# Patient Record
Sex: Female | Born: 2000
Health system: Southern US, Community
[De-identification: ages and names within clinical notes are randomized; demographics above are authoritative.]

## PROBLEM LIST (undated history)

## (undated) DIAGNOSIS — F329 Major depressive disorder, single episode, unspecified: Secondary | ICD-10-CM

## (undated) DIAGNOSIS — G43909 Migraine, unspecified, not intractable, without status migrainosus: Secondary | ICD-10-CM

## (undated) DIAGNOSIS — T50902A Poisoning by unspecified drugs, medicaments and biological substances, intentional self-harm, initial encounter: Secondary | ICD-10-CM

## (undated) DIAGNOSIS — F938 Other childhood emotional disorders: Secondary | ICD-10-CM

## (undated) HISTORY — PX: OTHER SURGICAL HISTORY: SHX169

## (undated) HISTORY — PX: WISDOM TOOTH EXTRACTION: SHX21

## (undated) HISTORY — PX: TONSILLECTOMY: SUR1361

---
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
## 2019-01-13 DIAGNOSIS — M79671 Pain in right foot: Secondary | ICD-10-CM | POA: Diagnosis not present

## 2019-01-13 DIAGNOSIS — M76821 Posterior tibial tendinitis, right leg: Secondary | ICD-10-CM | POA: Diagnosis not present

## 2019-01-13 DIAGNOSIS — Z23 Encounter for immunization: Secondary | ICD-10-CM | POA: Diagnosis not present

## 2019-01-20 DIAGNOSIS — M76821 Posterior tibial tendinitis, right leg: Secondary | ICD-10-CM | POA: Diagnosis not present

## 2019-01-20 DIAGNOSIS — M79671 Pain in right foot: Secondary | ICD-10-CM | POA: Diagnosis not present

## 2019-01-23 DIAGNOSIS — G43009 Migraine without aura, not intractable, without status migrainosus: Secondary | ICD-10-CM | POA: Diagnosis not present

## 2019-01-23 DIAGNOSIS — F329 Major depressive disorder, single episode, unspecified: Secondary | ICD-10-CM | POA: Diagnosis not present

## 2019-02-03 DIAGNOSIS — M76821 Posterior tibial tendinitis, right leg: Secondary | ICD-10-CM | POA: Diagnosis not present

## 2019-02-17 DIAGNOSIS — M76821 Posterior tibial tendinitis, right leg: Secondary | ICD-10-CM | POA: Diagnosis not present

## 2019-02-25 DIAGNOSIS — F329 Major depressive disorder, single episode, unspecified: Secondary | ICD-10-CM | POA: Diagnosis not present

## 2019-02-25 DIAGNOSIS — G43009 Migraine without aura, not intractable, without status migrainosus: Secondary | ICD-10-CM | POA: Diagnosis not present

## 2019-02-25 DIAGNOSIS — R32 Unspecified urinary incontinence: Secondary | ICD-10-CM | POA: Diagnosis not present

## 2019-02-27 DIAGNOSIS — R0602 Shortness of breath: Secondary | ICD-10-CM | POA: Diagnosis not present

## 2019-02-27 DIAGNOSIS — R079 Chest pain, unspecified: Secondary | ICD-10-CM | POA: Diagnosis not present

## 2019-02-27 DIAGNOSIS — R002 Palpitations: Secondary | ICD-10-CM | POA: Diagnosis not present

## 2019-02-27 DIAGNOSIS — F419 Anxiety disorder, unspecified: Secondary | ICD-10-CM | POA: Diagnosis not present

## 2019-04-10 ENCOUNTER — Emergency Department
Admission: EM | Admit: 2019-04-10 | Discharge: 2019-04-10 | Disposition: A | Discharge status: Home / Self Care | Payer: No Typology Code available for payment source | Attending: Emergency Medicine | Admitting: Emergency Medicine

## 2019-04-10 ENCOUNTER — Encounter: Payer: Self-pay | Admitting: *Deleted

## 2019-04-10 ENCOUNTER — Emergency Department: Payer: No Typology Code available for payment source

## 2019-04-10 ENCOUNTER — Other Ambulatory Visit: Payer: Self-pay

## 2019-04-10 DIAGNOSIS — Z7722 Contact with and (suspected) exposure to environmental tobacco smoke (acute) (chronic): Secondary | ICD-10-CM | POA: Insufficient documentation

## 2019-04-10 DIAGNOSIS — Y998 Other external cause status: Secondary | ICD-10-CM | POA: Diagnosis not present

## 2019-04-10 DIAGNOSIS — H538 Other visual disturbances: Secondary | ICD-10-CM | POA: Insufficient documentation

## 2019-04-10 DIAGNOSIS — S0990XA Unspecified injury of head, initial encounter: Secondary | ICD-10-CM | POA: Diagnosis not present

## 2019-04-10 DIAGNOSIS — Y92828 Other wilderness area as the place of occurrence of the external cause: Secondary | ICD-10-CM | POA: Diagnosis not present

## 2019-04-10 DIAGNOSIS — Y9389 Activity, other specified: Secondary | ICD-10-CM | POA: Insufficient documentation

## 2019-04-10 LAB — POC URINE PREG, ED: Preg Test, Ur: NEGATIVE
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
## 2019-04-10 NOTE — ED Notes (Signed)
Patient AAOx4. Vitals stable. NAD. 

## 2019-05-21 IMAGING — MR MR HEAD W/O CM
11 series · 46 of 48 positions shown · non-contrast
Comparison: CT head 09/07/2018

CLINICAL DATA: Numbness and tingling, paresthesia

EXAM:
MRI HEAD WITHOUT CONTRAST
TECHNIQUE: Multiplanar, multiecho pulse sequences of the brain and surrounding
structures were obtained without intravenous contrast.

[Series 2: ax dwi_tracew · axial · 3.0mm · 0.71mm/px · z∈[-113,+46]mm · 5 of 55 slices shown]
[im 1/55]
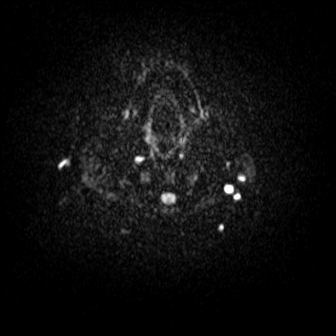
[im 14/55]
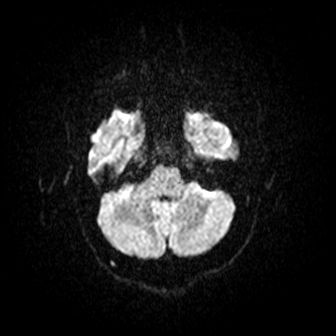
[im 28/55]
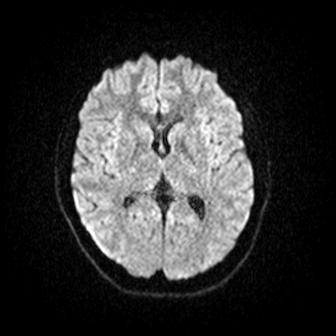
[im 41/55]
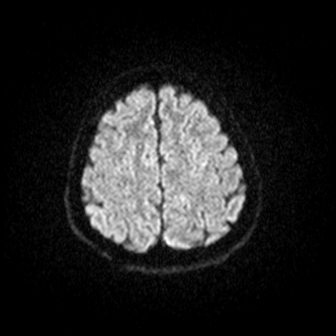
[im 55/55]
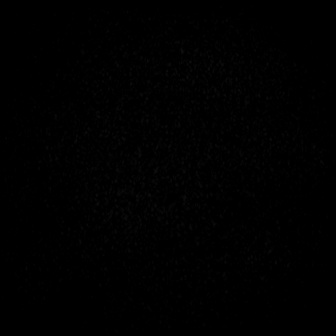

[Series 3: ax dwi_adc · axial · 3.0mm · 0.71mm/px · z∈[-113,+40]mm · 6 of 53 slices shown]
[im 1/53]
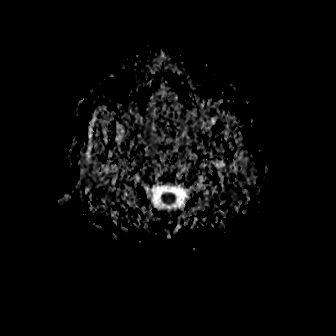
[im 11/53]
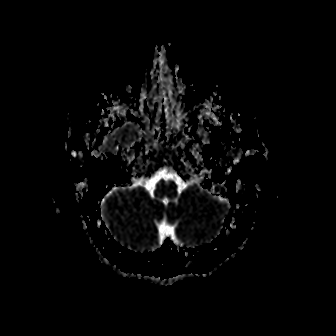
[im 21/53]
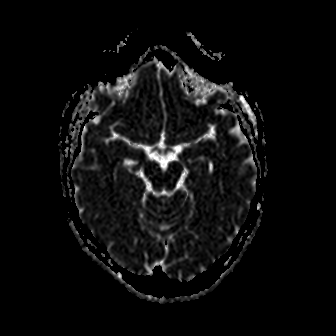
[im 32/53]
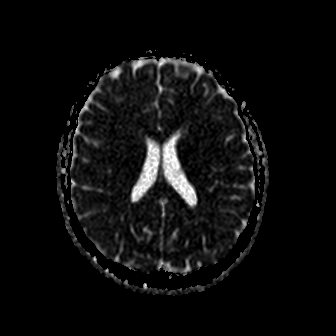
[im 42/53]
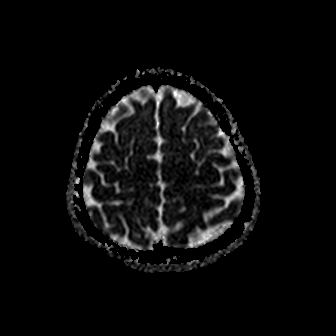
[im 53/53]
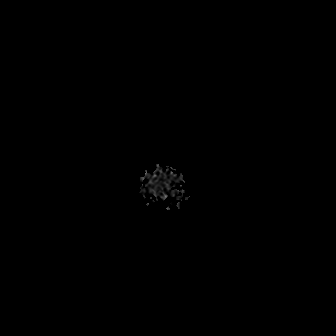

[Series 4: cor dwi_tracew · coronal · 5.0mm · 0.68mm/px · 5 of 40 slices shown]
[im 1/40]
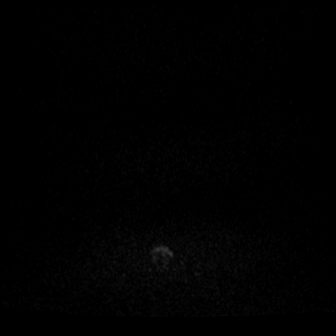
[im 10/40]
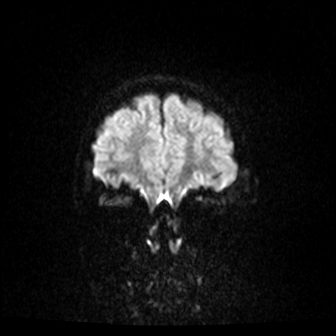
[im 20/40]
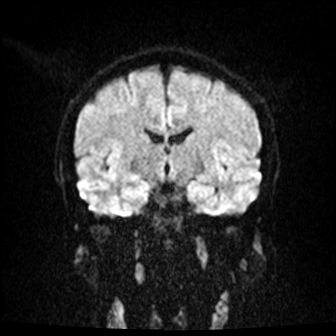
[im 30/40]
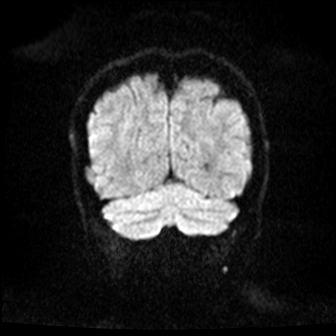
[im 40/40]
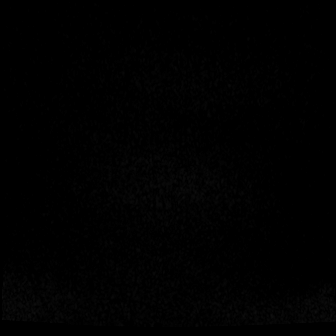

[Series 5: cor dwi_adc · coronal · 5.0mm · 0.68mm/px · 5 of 39 slices shown]
[im 1/39]
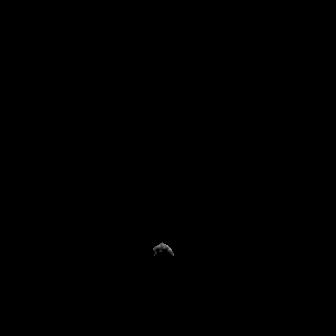
[im 10/39]
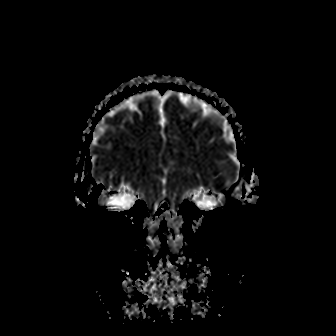
[im 20/39]
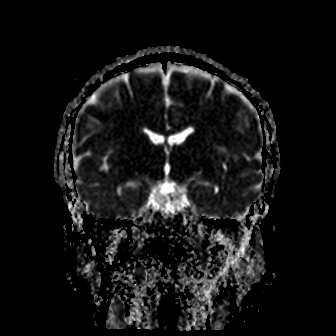
[im 29/39]
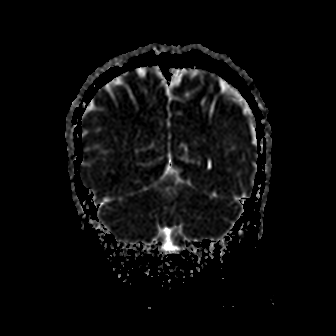
[im 39/39]
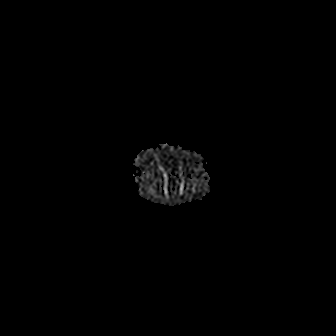

[Series 6: T1 · sagittal · 5.0mm · 0.94mm/px · 3 of 25 slices shown (1 of 2)]
[im 1/25]
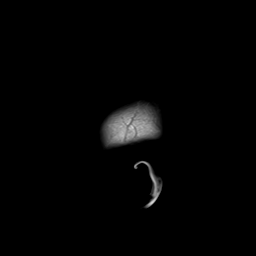
[im 13/25]
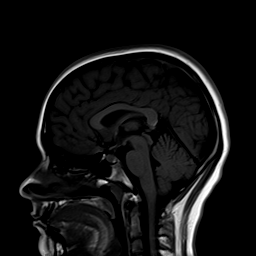
[im 25/25]
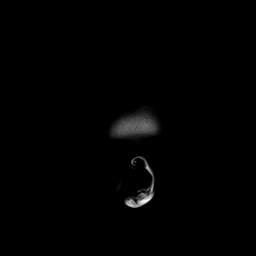

[Series 7: FLAIR · axial · 5.0mm · 1.20mm/px · z∈[-115,+38]mm · 3 of 27 slices shown]
[im 1/27]
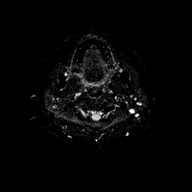
[im 14/27]
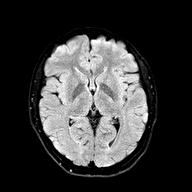
[im 27/27]
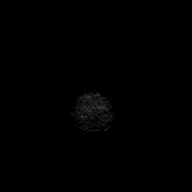

[Series 8: T2 · axial · 5.0mm · 0.45mm/px · z∈[-114,+38]mm · 3 of 27 slices shown (1 of 2)]
[im 1/27]
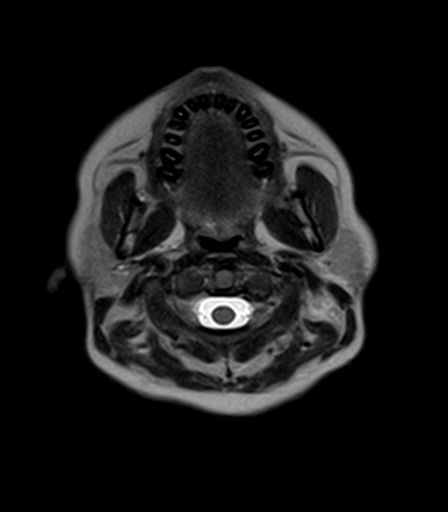
[im 14/27]
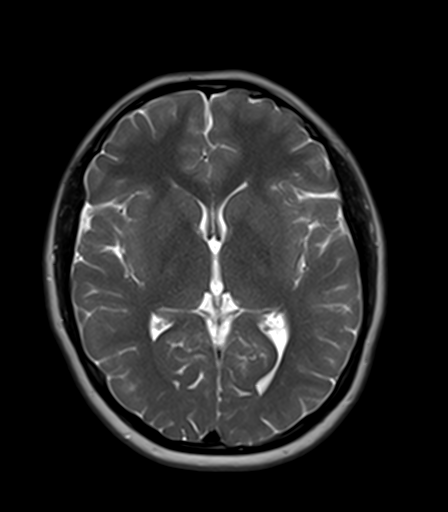
[im 27/27]
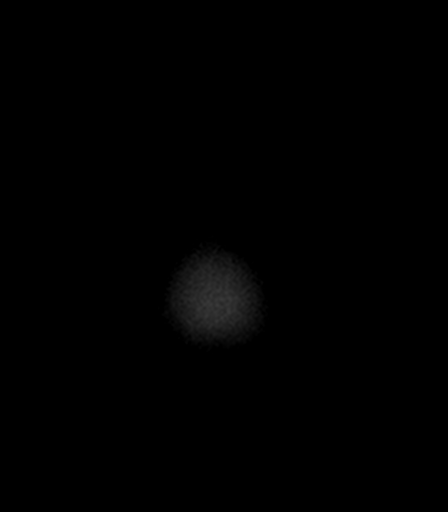

[Series 9: swi_images · axial · 3.0mm · 0.90mm/px · z∈[-121,+12]mm · 6 of 48 slices shown]
[im 1/48]
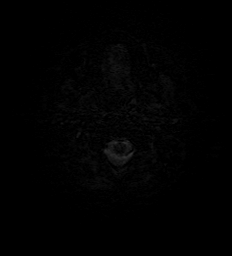
[im 10/48]
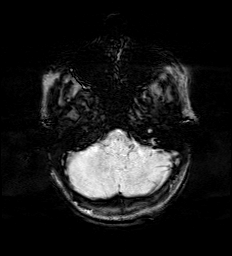
[im 19/48]
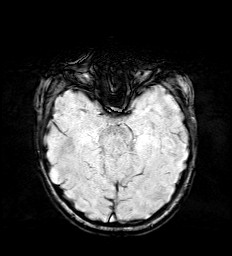
[im 29/48]
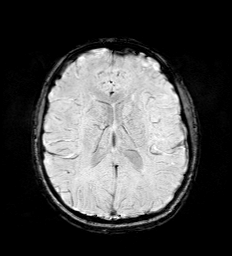
[im 38/48]
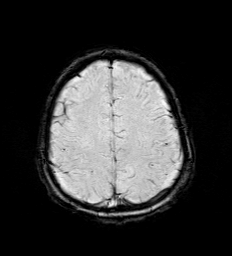
[im 48/48]
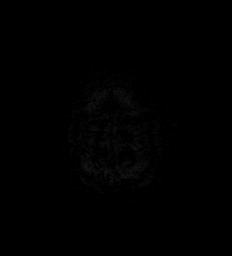

[Series 10: mip_images(sw) · axial · 24.0mm · 0.90mm/px · z∈[-111,-54]mm · 3 of 41 slices shown]
[im 1/41]
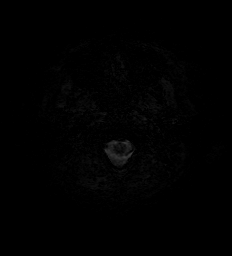
[im 11/41]
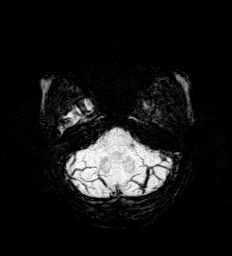
[im 21/41]
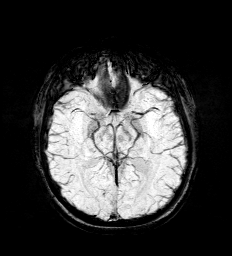

[Series 11: T1 · axial · 5.0mm · 0.90mm/px · z∈[-114,+38]mm · 3 of 27 slices shown (2 of 2)]
[im 1/27]
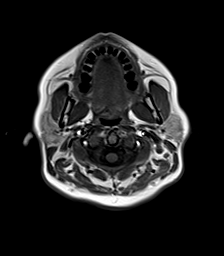
[im 14/27]
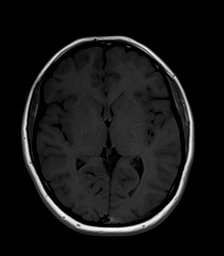
[im 27/27]
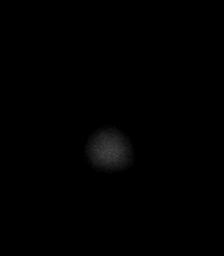

[Series 12: T2 · coronal · 5.0mm · 0.45mm/px · 4 of 31 slices shown (2 of 2)]
[im 1/31]
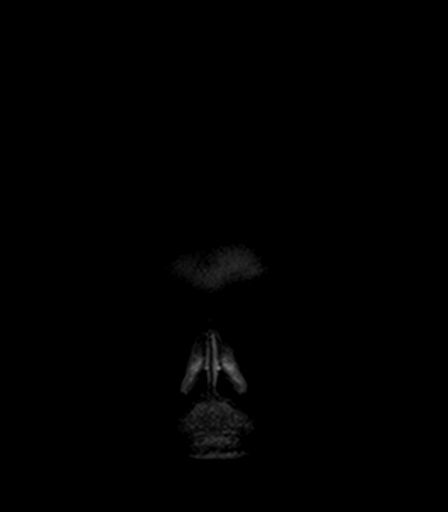
[im 11/31]
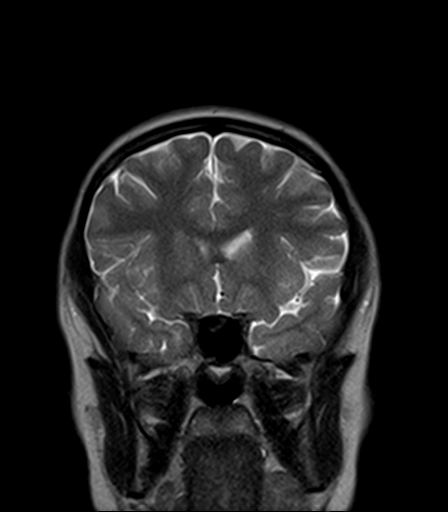
[im 21/31]
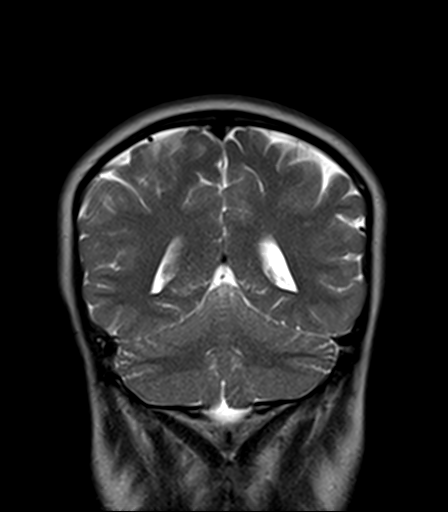
[im 31/31]
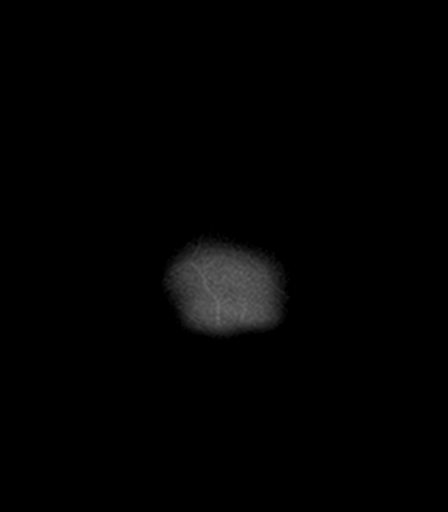

[46 of 48 positions shown; findings below may reference images not displayed]

FINDINGS: Brain: Ventricle size and cerebral volume normal. Negative for acute
infarct. Negative for hemorrhage or mass.

Solitary 5 mm hyperintensity left parietal subcortical white matter
best seen on FLAIR imaging but confirmed on axial T2 and coronal T2.
Remaining white matter normal. Brainstem and cerebellum normal.

Vascular: Normal arterial flow void

Skull and upper cervical spine: Negative

Sinuses/Orbits: Negative

Other: None
IMPRESSION: 5 mm hyperintensity left parietal subcortical white matter.
Otherwise negative. Differential diagnosis includes chronic ischemia
and demyelinating disease. Complex migraine headache also a
possibility. Neurologic correlation suggested.

## 2019-05-29 DIAGNOSIS — Z23 Encounter for immunization: Secondary | ICD-10-CM | POA: Diagnosis not present

## 2019-05-29 DIAGNOSIS — F329 Major depressive disorder, single episode, unspecified: Secondary | ICD-10-CM | POA: Diagnosis not present

## 2019-05-29 DIAGNOSIS — G43009 Migraine without aura, not intractable, without status migrainosus: Secondary | ICD-10-CM | POA: Diagnosis not present

## 2019-05-29 DIAGNOSIS — Z309 Encounter for contraceptive management, unspecified: Secondary | ICD-10-CM | POA: Diagnosis not present

## 2019-05-29 DIAGNOSIS — L309 Dermatitis, unspecified: Secondary | ICD-10-CM | POA: Diagnosis not present

## 2019-06-11 DIAGNOSIS — Z111 Encounter for screening for respiratory tuberculosis: Secondary | ICD-10-CM | POA: Diagnosis not present

## 2019-06-16 ENCOUNTER — Ambulatory Visit (INDEPENDENT_AMBULATORY_CARE_PROVIDER_SITE_OTHER): Payer: BLUE CROSS/BLUE SHIELD | Admitting: Obstetrics and Gynecology

## 2019-06-16 ENCOUNTER — Other Ambulatory Visit: Payer: Self-pay

## 2019-06-16 ENCOUNTER — Encounter: Payer: Self-pay | Admitting: Obstetrics and Gynecology

## 2019-06-16 VITALS — BP 118/74 | Ht 62.0 in | Wt 166.0 lb

## 2019-06-16 DIAGNOSIS — N926 Irregular menstruation, unspecified: Secondary | ICD-10-CM

## 2019-06-16 DIAGNOSIS — Z308 Encounter for other contraceptive management: Secondary | ICD-10-CM

## 2019-06-16 DIAGNOSIS — Z3202 Encounter for pregnancy test, result negative: Secondary | ICD-10-CM

## 2019-06-16 NOTE — Progress Notes (Signed)
Obstetrics & Gynecology Office Visit   Chief Complaint:  Chief Complaint  Patient presents with  . Contraception    pt late for cycle this month   Referral from Lonie PeakNathan Conroy, PA-C, from [facility] for evaluation of contraceptive options, including a copper IUD.  History of Present Illness: Patient is a 18 y.o. G1P0010 presenting for contraception discussion in referral from Lonie PeakNathan Conroy, Cordelia PochePA-C, from [facility].  She is currently on no form of contraception and desiring to start an IUD.  She has a past medical history significant for migraine with aura.  She specifically denies a history of chronic hypertension, history of DVT/PE and smoking.  Reported Patient's last menstrual period was 05/07/2019.  She had a Mirena IUD in the past.  It was removed due to some right-sided facial numbness that was thought to be attributable to the IUD.  She states that since her removal of her IUD she and her provider have determined that the more likely cause was due to her migraine headache issue.  She is on different medication now.  She states that she is 2 weeks late for her last menses.  She has taken home pregnancy tests, which have been negative.   Past Medical History:  Diagnosis Date  . 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 Major depressive disorder   . Suicide attempt by drug ingestion [facility](HCC)     Past Surgical History:  Procedure Laterality Date  . DILATION AND EVACUATION N/A 09/03/2015   Procedure: DILATATION AND EVACUATION;  Surgeon: Vena AustriaAndreas Staebler, MD;  Location: [facility] ORS;  Service: Gynecology;  Laterality: N/A;  . dnc    . TONSILLECTOMY N/A 04/25/2017   Procedure: TONSILLECTOMY;  Surgeon: Bud FaceVaught, Creighton, MD;  [facility];  Service: ENT;  Laterality: N/A;  . TONSILLECTOMY    . WISDOM TOOTH EXTRACTION      Gynecologic History: Patient's last menstrual period was 05/07/2019.  Obstetric History: G1P0010  Family  History  Problem Relation Age of Onset  . Hypertension Mother   . Anxiety disorder Mother   . Depression Mother   . Diabetes Maternal Grandmother   . Hypertension Maternal Grandmother   . Diabetes Maternal Grandfather   . Parkinson's disease Maternal Grandfather     Social History   Socioeconomic History  . Marital status: Single    Spouse name: Not on file  . Number of children: Not on file  . Years of education: Not on file  . Highest education level: Not on file  Occupational History  . Not on file  Social Needs  . Financial resource strain: Not on file  . Food insecurity    Worry: Not on file    Inability: Not on file  . Transportation needs    Medical: Not on file    Non-medical: Not on file  Tobacco Use  . Smoking status: Passive Smoke Exposure - Never Smoker  . Smokeless tobacco: Never Used  Substance and Sexual Activity  . Alcohol use: No  . Drug use: No  . Sexual activity: Yes    Birth control/protection: Pill  Lifestyle  . Physical activity    Days per week: 0 days    Minutes per session: Not on file  . Stress: Not on file  Relationships  . Social Musicianconnections    Talks on phone: Not on file    Gets together: Not on file    Attends religious service: Not on file    Active member of  club or organization: Not on file    Attends meetings of clubs or organizations: Not on file    Relationship status: Not on file  . Intimate partner violence    Fear of current or ex partner: Not on file    Emotionally abused: Not on file    Physically abused: Not on file    Forced sexual activity: Not on file  Other Topics Concern  . Not on file  Social History Narrative   ** Merged History Encounter **       Lives with Mother, Ronda Fairly.  No siblings.  1 cat.  Stepfather smokes outside.    No Known Allergies  Prior to Admission medications   Medication Sig Start Date End Date Taking? Authorizing Provider  ibuprofen (ADVIL,MOTRIN) 600 MG tablet Take 1 tablet (600 mg  total) by mouth every 8 (eight) hours as needed. 08/15/17  Yes Letitia Neri L, PA-C  lidocaine (XYLOCAINE) 2 % solution Use as directed 10 mLs in the mouth or throat every 6 (six) hours as needed for mouth pain (Swish and spit). 04/25/17  Yes Vaught, Jeannie Fend, MD  sertraline (ZOLOFT) 50 MG tablet Take 1.5 tablets (75 mg total) by mouth at bedtime. 03/20/17  Yes Ravi, Himabindu, MD  SUMAtriptan (IMITREX) 50 MG tablet Take 1 tablet (50 mg total) by mouth once as needed for migraine. May repeat in 2 hours if headache persists or recurs. 09/07/18 09/08/19 Yes Earleen Newport, MD    Review of Systems  Constitutional: Negative.   HENT: Negative.   Eyes: Negative.   Respiratory: Negative.   Cardiovascular: Negative.   Gastrointestinal: Negative.   Genitourinary: Negative.   Musculoskeletal: Negative.   Skin: Negative.   Neurological: Negative.   Psychiatric/Behavioral: Negative.      Physical Exam BP 118/74   Ht 5\' 2"  (1.575 m)   Wt 166 lb (75.3 kg)   LMP 05/07/2019   BMI 30.36 kg/m  Patient's last menstrual period was 05/07/2019. Physical Exam Constitutional:      General: She is not in acute distress.    Appearance: Normal appearance.  HENT:     Head: Normocephalic and atraumatic.  Eyes:     General: No scleral icterus.    Conjunctiva/sclera: Conjunctivae normal.  Neurological:     General: No focal deficit present.     Mental Status: She is alert and oriented to person, place, and time.     Cranial Nerves: No cranial nerve deficit.  Psychiatric:        Mood and Affect: Mood normal.        Behavior: Behavior normal.        Judgment: Judgment normal.   Urine Pregnancy Test: negative  Assessment: 18 y.o. G74P0010 female here for  1. Encounter for other contraceptive management   2. Missed period      Plan: Problem List Items Addressed This Visit    None    Visit Diagnoses    Encounter for other contraceptive management    -  Primary   Missed period        Relevant Orders   POCT urine pregnancy     Reviewed all forms of birth control options available including abstinence; over the counter/barrier methods; hormonal contraceptive medication including pill, patch, ring, injection,contraceptive implant; hormonal and nonhormonal IUDs; permanent sterilization options including vasectomy and the various tubal sterilization modalities. Risks and benefits reviewed.  Questions were answered.  Information was given to patient to review.  Copper IUDs were reviewed in detail.  I do think that she would be a good candidate for this form of birth control.  She did have questions regarding all types of IUDs.  We reviewed them each in detail.  Given that she believes her facial numbness may have been due to migraines rather than her IUD, she is considering a progesterone form of contraception.  I asked her to discuss this with her provider that manages her headaches.  There is no contraindication to progesterone in the setting of migraine headaches.  30 minutes spent in face to face discussion with > 50% spent in counseling,management, and coordination of care of her contraceptive management.   Thomasene MohairStephen , MD 06/16/2019 11:04 AM     CC: Lonie Peakonroy, Nathan, PA-C [address] [place],  [place] [id number]

## 2019-06-16 NOTE — Patient Instructions (Signed)
IUD options: Paragard: Copper IUD Mirena Psychologist, occupational): progesterone containing IUD Liletta: Very similar to [place], nearly the same thing Kyleena Psychologist, occupational): 5 year IUD with progesterone, smaller dose than Mirena Skyla Psychologist, occupational):  3 year IUD with progesterone, smaller dose than [place].

## 2019-06-17 ENCOUNTER — Encounter: Payer: Self-pay | Admitting: Obstetrics and Gynecology

## 2019-06-17 LAB — POCT URINE PREGNANCY: Preg Test, Ur: NEGATIVE

## 2019-07-06 DIAGNOSIS — Z3202 Encounter for pregnancy test, result negative: Secondary | ICD-10-CM | POA: Diagnosis not present

## 2019-07-06 DIAGNOSIS — N912 Amenorrhea, unspecified: Secondary | ICD-10-CM | POA: Diagnosis not present

## 2019-07-06 DIAGNOSIS — Z20828 Contact with and (suspected) exposure to other viral communicable diseases: Secondary | ICD-10-CM | POA: Diagnosis not present

## 2019-07-06 DIAGNOSIS — J3489 Other specified disorders of nose and nasal sinuses: Secondary | ICD-10-CM | POA: Diagnosis not present

## 2019-07-07 ENCOUNTER — Ambulatory Visit (INDEPENDENT_AMBULATORY_CARE_PROVIDER_SITE_OTHER): Payer: BC Managed Care – PPO | Admitting: Obstetrics and Gynecology

## 2019-07-07 ENCOUNTER — Encounter: Payer: Self-pay | Admitting: Obstetrics and Gynecology

## 2019-07-07 ENCOUNTER — Other Ambulatory Visit: Payer: Self-pay

## 2019-07-07 VITALS — BP 102/70 | Ht 63.0 in | Wt 169.0 lb

## 2019-07-07 DIAGNOSIS — Z3202 Encounter for pregnancy test, result negative: Secondary | ICD-10-CM | POA: Diagnosis not present

## 2019-07-07 DIAGNOSIS — N914 Secondary oligomenorrhea: Secondary | ICD-10-CM

## 2019-07-07 DIAGNOSIS — N912 Amenorrhea, unspecified: Secondary | ICD-10-CM

## 2019-07-07 LAB — POCT URINE PREGNANCY: Preg Test, Ur: NEGATIVE

## 2019-07-07 MED ORDER — MEDROXYPROGESTERONE ACETATE 10 MG PO TABS: 10.0000 mg | ORAL_TABLET | Freq: Every day | ORAL | 0 refills | Status: DC

## 2019-07-07 NOTE — Progress Notes (Signed)
Obstetrics & Gynecology Office Visit   Chief Complaint  Patient presents with  . Amenorrhea    No cycle since June, negative urine pregnancy tests   History of Present Illness: 18 y.o. 531P0010 female who presents with no menses since late May. Prior to late May her menses would come each month and lasted a predictable amount of time.  She has been on no form of birth control.  She would like to try the Mirena again.  She has had no symptoms of having a period.  Her appetite is much decreased.  However, she hasn't lost much weight. She has had no medication changes. She denies any new stressors in her life.   Past Medical History:  Diagnosis Date  . 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 Major depressive disorder   . Suicide attempt by drug ingestion [facility](HCC)     Past Surgical History:  Procedure Laterality Date  . DILATION AND EVACUATION N/A 09/03/2015   Procedure: DILATATION AND EVACUATION;  Surgeon: Vena AustriaAndreas Staebler, MD;  Location: [facility] ORS;  Service: Gynecology;  Laterality: N/A;  . dnc    . TONSILLECTOMY N/A 04/25/2017   Procedure: TONSILLECTOMY;  Surgeon: Bud FaceVaught, Creighton, MD;  [facility];  Service: ENT;  Laterality: N/A;  . TONSILLECTOMY    . WISDOM TOOTH EXTRACTION      Gynecologic History: Patient's last menstrual period was 04/28/2019 (approximate).  Obstetric History: G1P0010  Family History  Problem Relation Age of Onset  . Hypertension Mother   . Anxiety disorder Mother   . Depression Mother   . Diabetes Maternal Grandmother   . Hypertension Maternal Grandmother   . Diabetes Maternal Grandfather   . Parkinson's disease Maternal Grandfather     Social History   Socioeconomic History  . Marital status: Single    Spouse name: Not on file  . Number of children: Not on file  . Years of education: Not on file  . Highest education level: Not on file  Occupational History  . Not on file  Social Needs  . Financial resource strain: Not on  file  . Food insecurity    Worry: Not on file    Inability: Not on file  . Transportation needs    Medical: Not on file    Non-medical: Not on file  Tobacco Use  . Smoking status: Passive Smoke Exposure - Never Smoker  . Smokeless tobacco: Never Used  Substance and Sexual Activity  . Alcohol use: No  . Drug use: No  . Sexual activity: Yes  Lifestyle  . Physical activity    Days per week: 0 days    Minutes per session: Not on file  . Stress: Not on file  Relationships  . Social Musicianconnections    Talks on phone: Not on file    Gets together: Not on file    Attends religious service: Not on file    Active member of club or organization: Not on file    Attends meetings of clubs or organizations: Not on file    Relationship status: Not on file  . Intimate partner violence    Fear of current or ex partner: Not on file    Emotionally abused: Not on file    Physically abused: Not on file    Forced sexual activity: Not on file  Other Topics Concern  . Not on file  Social History Narrative   ** Merged History Encounter **       Lives with Mother, Peggye PittStepdad.  No siblings.  1 cat.  Stepfather smokes outside.    No Known Allergies  Prior to Admission medications   Medication Sig Start Date End Date Taking? Authorizing Provider  ibuprofen (ADVIL,MOTRIN) 600 MG tablet Take 1 tablet (600 mg total) by mouth every 8 (eight) hours as needed. 08/15/17  Yes Letitia Neri L, PA-C  lidocaine (XYLOCAINE) 2 % solution Use as directed 10 mLs in the mouth or throat every 6 (six) hours as needed for mouth pain (Swish and spit). 04/25/17  Yes Vaught, Jeannie Fend, MD  sertraline (ZOLOFT) 50 MG tablet Take 1.5 tablets (75 mg total) by mouth at bedtime. 03/20/17  Yes Ravi, Himabindu, MD  SUMAtriptan (IMITREX) 50 MG tablet Take 1 tablet (50 mg total) by mouth once as needed for migraine. May repeat in 2 hours if headache persists or recurs. 09/07/18 09/08/19 Yes Earleen Newport, MD    Review of Systems   Constitutional: Negative.   HENT: Negative.   Eyes: Negative.   Respiratory: Negative.   Cardiovascular: Negative.   Gastrointestinal: Negative.   Genitourinary: Negative.   Musculoskeletal: Negative.   Skin: Negative.   Neurological: Negative.   Psychiatric/Behavioral: Negative.      Physical Exam BP 102/70   Ht 5\' 3"  (1.6 m)   Wt 169 lb (76.7 kg)   LMP 04/28/2019 (Approximate)   BMI 29.94 kg/m  Patient's last menstrual period was 04/28/2019 (approximate). Physical Exam Constitutional:      General: She is not in acute distress.    Appearance: Normal appearance.  HENT:     Head: Normocephalic and atraumatic.  Eyes:     General: No scleral icterus.    Conjunctiva/sclera: Conjunctivae normal.  Neurological:     General: No focal deficit present.     Mental Status: She is alert and oriented to person, place, and time.     Cranial Nerves: No cranial nerve deficit.  Psychiatric:        Mood and Affect: Mood normal.        Behavior: Behavior normal.        Judgment: Judgment normal.   Urine Pregnancy Test: Negative  Assessment: 18 y.o. G106P0010 female here for  1. Secondary oligomenorrhea   2. Amenorrhea      Plan: Problem List Items Addressed This Visit    None    Visit Diagnoses    Secondary oligomenorrhea    -  Primary   Relevant Medications   medroxyPROGESTERone (PROVERA) 10 MG tablet   Amenorrhea       Relevant Orders   POCT urine pregnancy (Completed)     Oligomenorrhea: Etiology is unclear.  Diagnostic considerations include PCOS, other anovulatory etiologies. As this is a single occurrence for her and she has no track record of oligomenorrhea, it is reasonable to not pursue a diagnostic work-up for PCOS.  However, if her symptoms were to continue a work-up would be indicated.  She denies hirsutism or increase in acne.  So there are no physical manifestations of hyperandrogenism.  We will try to affect menstruation using progesterone withdrawal.  She would  like an IUD placed after her menses.  Therefore once her withdrawal begins we will schedule her for a Mirena IUD placement.  We did discuss that her menses may be heavier than normal since she has not had a period in several months.  15 minutes spent in face to face discussion with > 50% spent in counseling,management, and coordination of care of her secondary oligomenorrhea.   Prentice Docker, MD  07/07/2019 4:37 PM

## 2019-07-22 ENCOUNTER — Telehealth: Payer: Self-pay

## 2019-07-22 NOTE — Telephone Encounter (Signed)
Pt having problems with the [facility] pills wants to know what to do to get the IUD,

## 2019-07-22 NOTE — Telephone Encounter (Signed)
Please schedule for Birth control consult when there is an opening. No need for double book. Thank you so much!

## 2019-07-23 ENCOUNTER — Telehealth: Payer: Self-pay | Admitting: Obstetrics and Gynecology

## 2019-07-23 NOTE — Telephone Encounter (Signed)
Patient is schedule for 08/11/19 at 3:50 with [facility] for Consult with possible Mirena insertion

## 2019-07-25 NOTE — Telephone Encounter (Signed)
Noted. Will order to arrive by apt date/time. 

## 2019-08-11 ENCOUNTER — Encounter: Payer: Self-pay | Admitting: Obstetrics and Gynecology

## 2019-08-11 ENCOUNTER — Other Ambulatory Visit: Payer: Self-pay

## 2019-08-11 ENCOUNTER — Ambulatory Visit (INDEPENDENT_AMBULATORY_CARE_PROVIDER_SITE_OTHER): Payer: BC Managed Care – PPO | Admitting: Obstetrics and Gynecology

## 2019-08-11 VITALS — BP 118/74 | Ht 63.0 in | Wt 179.0 lb

## 2019-08-11 DIAGNOSIS — N912 Amenorrhea, unspecified: Secondary | ICD-10-CM | POA: Diagnosis not present

## 2019-08-11 NOTE — Progress Notes (Signed)
Obstetrics & Gynecology Office Visit   Chief Complaint  Patient presents with  . Amenorrhea   History of Present Illness: Patient is a 18 y.o. G51P0010 female who presents in follow-up from her visit in August.  She states that she took Provera for 10 days after her last visit.  She only had a very short interval of bleeding (part of a day).  She has had no symptoms since that time.  She would like to inquire about why she is not having periods and determine what form of birth control she should be on.  She has no other symptoms at this time.  Of note, she was diagnosed with COVID-19 in the interim in September and quarantined for 3 weeks.  She has no symptoms of that at this time.  Past Medical History:  Diagnosis Date  . 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 Major depressive disorder   . Suicide attempt by drug ingestion [facility])     Past Surgical History:  Procedure Laterality Date  . DILATION AND EVACUATION N/A 09/03/2015   Procedure: DILATATION AND EVACUATION;  Surgeon: Vena Austria, MD;  Location: [facility] ORS;  Service: Gynecology;  Laterality: N/A;  . dnc    . TONSILLECTOMY N/A 04/25/2017   Procedure: TONSILLECTOMY;  Surgeon: Bud Face, MD;  [facility];  Service: ENT;  Laterality: N/A;  . TONSILLECTOMY    . WISDOM TOOTH EXTRACTION      Gynecologic History: Patient's last menstrual period was 04/11/2019.  Obstetric History: G1P0010  Family History  Problem Relation Age of Onset  . Hypertension Mother   . Anxiety disorder Mother   . Depression Mother   . Diabetes Maternal Grandmother   . Hypertension Maternal Grandmother   . Diabetes Maternal Grandfather   . Parkinson's disease Maternal Grandfather     Social History   Socioeconomic History  . Marital status: Single    Spouse name: Not on file  . Number of children: Not on file  . Years of education: Not on file  . Highest education level: Not on file  Occupational History  . Not  on file  Social Needs  . Financial resource strain: Not on file  . Food insecurity    Worry: Not on file    Inability: Not on file  . Transportation needs    Medical: Not on file    Non-medical: Not on file  Tobacco Use  . Smoking status: Passive Smoke Exposure - Never Smoker  . Smokeless tobacco: Never Used  Substance and Sexual Activity  . Alcohol use: No  . Drug use: No  . Sexual activity: Yes  Lifestyle  . Physical activity    Days per week: 0 days    Minutes per session: Not on file  . Stress: Not on file  Relationships  . Social Musician on phone: Not on file    Gets together: Not on file    Attends religious service: Not on file    Active member of club or organization: Not on file    Attends meetings of clubs or organizations: Not on file    Relationship status: Not on file  . Intimate partner violence    Fear of current or ex partner: Not on file    Emotionally abused: Not on file    Physically abused: Not on file    Forced sexual activity: Not on file  Other Topics Concern  . Not on file  Social History Narrative   **  Merged History Encounter **       Lives with Mother, Ronda Fairly.  No siblings.  1 cat.  Stepfather smokes outside.    No Known Allergies  Prior to Admission medications   Medication Sig Start Date End Date Taking? Authorizing Provider  ibuprofen (ADVIL,MOTRIN) 600 MG tablet Take 1 tablet (600 mg total) by mouth every 8 (eight) hours as needed. 08/15/17  Yes Letitia Neri L, PA-C  lidocaine (XYLOCAINE) 2 % solution Use as directed 10 mLs in the mouth or throat every 6 (six) hours as needed for mouth pain (Swish and spit). 04/25/17  Yes Vaught, Jeannie Fend, MD  sertraline (ZOLOFT) 50 MG tablet Take 1.5 tablets (75 mg total) by mouth at bedtime. 03/20/17  Yes Ravi, Himabindu, MD  SUMAtriptan (IMITREX) 100 MG tablet Take 100 mg by mouth daily. 02/21/19  Yes [provider]  triamcinolone cream (KENALOG) 0.1 %  05/23/18  Yes [provider]  medroxyPROGESTERone (PROVERA) 10 MG tablet Take 1 tablet (10 mg total) by mouth daily for 10 days. 07/07/19 07/17/19  Will Bonnet, MD    Review of Systems  Constitutional: Negative.   HENT: Negative.   Eyes: Negative.   Respiratory: Negative.   Cardiovascular: Negative.   Gastrointestinal: Negative.   Genitourinary: Negative.   Musculoskeletal: Negative.   Skin: Negative.   Neurological: Negative.   Psychiatric/Behavioral: Negative.      Physical Exam BP 118/74   Ht 5\' 3"  (1.6 m)   Wt 179 lb (81.2 kg)   LMP 04/11/2019   BMI 31.71 kg/m  Patient's last menstrual period was 04/11/2019. Physical Exam Constitutional:      General: She is not in acute distress.    Appearance: Normal appearance.  HENT:     Head: Normocephalic and atraumatic.  Eyes:     General: No scleral icterus.    Conjunctiva/sclera: Conjunctivae normal.  Neurological:     General: No focal deficit present.     Mental Status: She is alert and oriented to person, place, and time.     Cranial Nerves: No cranial nerve deficit.  Psychiatric:        Mood and Affect: Mood normal.        Behavior: Behavior normal.        Judgment: Judgment normal.     Female chaperone present for pelvic and breast  portions of the physical exam  Assessment: 18 y.o. G50P0010 female here for  1. Amenorrhea      Plan: Problem List Items Addressed This Visit    None    Visit Diagnoses    Amenorrhea    -  Primary   Relevant Orders   US PELVIS TRANSVAGINAL NON-OB (TV ONLY)     Will initiate a secondary amenorrhea work-up.  We will start with pelvic ultrasound and the patient will need lab work at her next visit.  15 minutes spent in face to face discussion with > 50% spent in counseling,management, and coordination of care of her secondary amenorrhea.  Prentice Docker, MD 08/11/2019 4:10 PM

## 2019-08-12 ENCOUNTER — Ambulatory Visit (INDEPENDENT_AMBULATORY_CARE_PROVIDER_SITE_OTHER): Payer: BC Managed Care – PPO

## 2019-08-12 DIAGNOSIS — N912 Amenorrhea, unspecified: Secondary | ICD-10-CM

## 2019-08-12 DIAGNOSIS — N83201 Unspecified ovarian cyst, right side: Secondary | ICD-10-CM | POA: Diagnosis not present

## 2019-08-13 ENCOUNTER — Other Ambulatory Visit: Payer: Self-pay

## 2019-08-13 ENCOUNTER — Ambulatory Visit (INDEPENDENT_AMBULATORY_CARE_PROVIDER_SITE_OTHER): Payer: BC Managed Care – PPO | Admitting: Obstetrics and Gynecology

## 2019-08-13 ENCOUNTER — Encounter: Payer: Self-pay | Admitting: Obstetrics and Gynecology

## 2019-08-13 VITALS — BP 131/87 | HR 102 | Wt 174.0 lb

## 2019-08-13 DIAGNOSIS — N911 Secondary amenorrhea: Secondary | ICD-10-CM | POA: Diagnosis not present

## 2019-08-13 NOTE — Progress Notes (Signed)
Gynecology Ultrasound Follow Up   Chief Complaint  Patient presents with  . Follow-up    Gyn Ultrasound  Secondary amenorrhea   History of Present Illness: Patient is a 18 y.o. female who presents today for ultrasound evaluation of the above .  Ultrasound demonstrates the following findings Adnexa: 2 cm complex right ovarian cyst. No internal blood flow. Likely a corpus luteal cyst. Uterus: anteverted with endometrial stripe  5.9 mm Additional: no abnormalities noted.  See prior note for bleeding history.   Past Medical History:  Diagnosis Date  . 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 Major depressive disorder   . Suicide attempt by drug ingestion [facility])     Past Surgical History:  Procedure Laterality Date  . DILATION AND EVACUATION N/A 09/03/2015   Procedure: DILATATION AND EVACUATION;  Surgeon: Vena Austria, MD;  Location: [facility] ORS;  Service: Gynecology;  Laterality: N/A;  . dnc    . TONSILLECTOMY N/A 04/25/2017   Procedure: TONSILLECTOMY;  Surgeon: Bud Face, MD;  Location: [facility];  Service: ENT;  Laterality: N/A;  . TONSILLECTOMY    . WISDOM TOOTH EXTRACTION       Family History  Problem Relation Age of Onset  . Hypertension Mother   . Anxiety disorder Mother   . Depression Mother   . Diabetes Maternal Grandmother   . Hypertension Maternal Grandmother   . Diabetes Maternal Grandfather   . Parkinson's disease Maternal Grandfather     Social History   Socioeconomic History  . Marital status: Single    Spouse name: Not on file  . Number of children: Not on file  . Years of education: Not on file  . Highest education level: Not on file  Occupational History  . Not on file  Social Needs  . Financial resource strain: Not on file  . Food insecurity    Worry: Not on file    Inability: Not on file  . Transportation needs    Medical: Not on file    Non-medical: Not on file  Tobacco Use  . Smoking status: Passive Smoke  Exposure - Never Smoker  . Smokeless tobacco: Never Used  Substance and Sexual Activity  . Alcohol use: No  . Drug use: No  . Sexual activity: Yes  Lifestyle  . Physical activity    Days per week: 0 days    Minutes per session: Not on file  . Stress: Not on file  Relationships  . Social Musician on phone: Not on file    Gets together: Not on file    Attends religious service: Not on file    Active member of club or organization: Not on file    Attends meetings of clubs or organizations: Not on file    Relationship status: Not on file  . Intimate partner violence    Fear of current or ex partner: Not on file    Emotionally abused: Not on file    Physically abused: Not on file    Forced sexual activity: Not on file  Other Topics Concern  . Not on file  Social History Narrative   ** Merged History Encounter **       Lives with Mother, [name].  No siblings.  1 cat.  Stepfather smokes outside.    No Known Allergies  Prior to Admission medications   Medication Sig Start Date End Date Taking? Authorizing Provider  ibuprofen (ADVIL,MOTRIN) 600 MG tablet Take 1 tablet (600 mg total)  by mouth every 8 (eight) hours as needed. 08/15/17   Johnn Hai, PA-C  lidocaine (XYLOCAINE) 2 % solution Use as directed 10 mLs in the mouth or throat every 6 (six) hours as needed for mouth pain (Swish and spit). 04/25/17   Carloyn Manner, MD  medroxyPROGESTERone (PROVERA) 10 MG tablet Take 1 tablet (10 mg total) by mouth daily for 10 days. 07/07/19 07/17/19  Will Bonnet, MD  sertraline (ZOLOFT) 50 MG tablet Take 1.5 tablets (75 mg total) by mouth at bedtime. 03/20/17   Elvin So, MD  SUMAtriptan (IMITREX) 100 MG tablet Take 100 mg by mouth daily. 02/21/19   [provider]  triamcinolone cream (KENALOG) 0.1 %  05/23/18   [provider]    Physical Exam BP 131/87   Pulse (!) 102   Wt 174 lb (78.9 kg)   BMI 30.82 kg/m    General: NAD HEENT:  normocephalic, anicteric Pulmonary: No increased work of breathing Extremities: no edema, erythema, or tenderness Neurologic: Grossly intact, normal gait Psychiatric: mood appropriate, affect full  US Pelvis Transvaginal Non-ob (tv Only)  Result Date: 08/12/2019 Patient Name: Mckenzie Brown DOB: 03/02/2001 MRN: [id number] ULTRASOUND REPORT Location: [facility] Date of Service: 08/12/2019 Indications: amenorrhea Findings: The uterus is anteverted and measures 7.8 x 4.5 x 3.1 cm. Echo texture is homogenous without evidence of focal masses. The Endometrium measures 5.9 mm. Right Ovary measures 4.3 x 2.2 x 2.4  cm. It is not normal in appearance. There is a small complex cyst in the right ovary measuring 20 x 19 x 17 mm. No blood flow is seen within this cyst. This may represent a corpus luteal cyst. Left Ovary measures 3.00 x 1.7 x 1.5 cm. It is normal in appearance. Survey of the adnexa demonstrates no adnexal masses. There is no free fluid in the cul de sac. Impression: 1. Normal uterus and cervix. 2. There is a 2.0 cm complex cyst in the right ovary. No blood flow within. 3. Normal appearing left ovary. Gweneth Dimitri, RT The ultrasound images and findings were reviewed by me and I agree with the above report. Prentice Docker, MD, [facility], [facility] 08/12/2019 9:29 PM   r  Assessment: 18 y.o. G1P0010  1. Secondary amenorrhea      Plan: Problem List Items Addressed This Visit    None    Visit Diagnoses    Secondary amenorrhea    -  Primary   Relevant Orders   TSH   Prolactin   Follicle stimulating hormone     Discussed findings on ultrasound today.  The endometrial stripe was not excessively thin nor excessively thick.  So, this is more of an equivocal study but is helpful in helping [place] determine that there is no definite absence of estrogen nor definite excess of estrogen.  Per ASRM guidelines, we will draw TSH, prolactin, and FSH today.  This well plus  determine why she has an absence of menses at this point.  15 minutes spent in face to face discussion with > 50% spent in counseling,management, and coordination of care of her secondary amenorrhea.   Prentice Docker, MD, [facility], [facility] 08/13/2019 4:59 PM

## 2019-08-14 ENCOUNTER — Other Ambulatory Visit: Payer: BC Managed Care – PPO

## 2019-08-14 DIAGNOSIS — N911 Secondary amenorrhea: Secondary | ICD-10-CM | POA: Diagnosis not present

## 2019-08-15 LAB — TSH: TSH: 2.93 u[IU]/mL (ref 0.450–4.500)

## 2019-08-15 LAB — FOLLICLE STIMULATING HORMONE: FSH: 5.8 m[IU]/mL

## 2019-08-15 LAB — PROLACTIN: Prolactin: 37.5 ng/mL — ABNORMAL HIGH (ref 4.8–23.3)

## 2019-08-18 ENCOUNTER — Telehealth: Payer: Self-pay

## 2019-08-18 NOTE — Telephone Encounter (Signed)
Patient inquiring about 08/14/2019 lab results. [id number]

## 2019-08-19 NOTE — Telephone Encounter (Signed)
Pt calling to talk c SDJ about lab results.  Also, wants to set up pt portal but needs help.  [phone]

## 2019-08-19 NOTE — Telephone Encounter (Signed)
Pt aware SDJ not in office and will call her when he can with results

## 2019-08-20 NOTE — Telephone Encounter (Signed)
Does Mckenzie Brown know I won't be back in the office until Thursday?

## 2019-08-20 NOTE — Telephone Encounter (Signed)
Yes sir, she is aware.

## 2019-08-21 ENCOUNTER — Telehealth: Payer: Self-pay | Admitting: Obstetrics and Gynecology

## 2019-08-21 NOTE — Telephone Encounter (Signed)
Left generic VM 

## 2019-08-21 NOTE — Telephone Encounter (Signed)
Patient is returning call from SDJ. Please advise  °

## 2019-08-21 NOTE — Telephone Encounter (Signed)
Tried to call her today. Left a voicemail

## 2019-08-21 NOTE — Telephone Encounter (Signed)
Left another VM 

## 2019-08-21 NOTE — Telephone Encounter (Signed)
Left another generic VM 

## 2019-08-21 NOTE — Telephone Encounter (Signed)
Patient is returning call. Please advise? 

## 2019-08-22 ENCOUNTER — Other Ambulatory Visit: Payer: Self-pay | Admitting: Obstetrics and Gynecology

## 2019-08-22 DIAGNOSIS — Z23 Encounter for immunization: Secondary | ICD-10-CM | POA: Diagnosis not present

## 2019-08-22 DIAGNOSIS — E221 Hyperprolactinemia: Secondary | ICD-10-CM

## 2019-08-22 NOTE — Telephone Encounter (Signed)
I think pt is wanting to schedule an appt to come in. Waiting to hear back from her

## 2019-08-25 NOTE — Telephone Encounter (Signed)
Pt has appt scheduled.

## 2019-08-28 ENCOUNTER — Encounter: Payer: BC Managed Care – PPO | Admitting: Obstetrics and Gynecology

## 2019-09-01 DIAGNOSIS — Z20828 Contact with and (suspected) exposure to other viral communicable diseases: Secondary | ICD-10-CM | POA: Diagnosis not present

## 2019-09-01 DIAGNOSIS — J069 Acute upper respiratory infection, unspecified: Secondary | ICD-10-CM | POA: Diagnosis not present

## 2019-09-04 DIAGNOSIS — Z6830 Body mass index (BMI) 30.0-30.9, adult: Secondary | ICD-10-CM | POA: Diagnosis not present

## 2019-09-04 DIAGNOSIS — J029 Acute pharyngitis, unspecified: Secondary | ICD-10-CM | POA: Diagnosis not present

## 2019-09-09 ENCOUNTER — Other Ambulatory Visit: Payer: Self-pay

## 2019-09-11 ENCOUNTER — Encounter: Payer: Self-pay | Admitting: Endocrinology

## 2019-09-11 ENCOUNTER — Ambulatory Visit (INDEPENDENT_AMBULATORY_CARE_PROVIDER_SITE_OTHER): Payer: BC Managed Care – PPO | Admitting: Endocrinology

## 2019-09-11 ENCOUNTER — Other Ambulatory Visit: Payer: Self-pay

## 2019-09-11 VITALS — BP 104/70 | HR 108 | Ht 63.0 in | Wt 169.2 lb

## 2019-09-11 DIAGNOSIS — E221 Hyperprolactinemia: Secondary | ICD-10-CM | POA: Diagnosis not present

## 2019-09-11 MED ORDER — BROMOCRIPTINE MESYLATE 2.5 MG PO TABS: 2.5000 mg | ORAL_TABLET | Freq: Every day | ORAL | 11 refills | Status: DC

## 2019-09-11 NOTE — Patient Instructions (Addendum)
Please start taking "bromocriptine," to lower the prolactin. It has possible side effects of nausea and dizziness.  These go away with time.  You can avoid these by taking it at bedtime, and by taking just take 1/2 pill for the first week.  Please note this medication will increase your chances of a pregnancy.  Please redo the blood test in 1 month.  Please do the our [place] office.  Please call ahead, so they can get you in and out fast.   Please come back for a follow-up appointment in 6 months.

## 2019-09-11 NOTE — Progress Notes (Signed)
Subjective:    Patient ID: Mckenzie Brown, female    DOB: Mar 24, 2001, 18 y.o.   MRN: [id number]  HPI Pt is referred byDr Jean Rosenthal, for hyperprolactinemia.  Pt had menarche at age 27.  She has always had regular menses. She is G1P0 (1 miscarriage).  She does not want a pregnancy now, but would like to preserve fertility for the future.  She took oral contraceptives 2013-2018.  She did not tolerate Mirena device (headache). She was noted to have an elevated prolactin in 2018.  Menses stopped in mid-2020.  She denies the following: excessive exercise, opiates, brain XRT, brain surgery, cirrhosis,  thyroid dz, and seizures.  Pt denies h/o h/o infertility, PCO, renal dz, zoster, brain injury, or chest wall injury.  She reports moderate weight gain, and assoc headache.    Past Medical History:  Diagnosis Date  . 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 Major depressive disorder   . Suicide attempt by drug ingestion [facility])     Past Surgical History:  Procedure Laterality Date  . DILATION AND EVACUATION N/A 09/03/2015   Procedure: DILATATION AND EVACUATION;  Surgeon: Vena Austria, MD;  Location: [facility] ORS;  Service: Gynecology;  Laterality: N/A;  . dnc    . TONSILLECTOMY N/A 04/25/2017   Procedure: TONSILLECTOMY;  Surgeon: Bud Face, MD;  Location: [facility];  Service: ENT;  Laterality: N/A;  . TONSILLECTOMY    . WISDOM TOOTH EXTRACTION      Social History   Socioeconomic History  . Marital status: Single    Spouse name: Not on file  . Number of children: Not on file  . Years of education: Not on file  . Highest education level: Not on file  Occupational History  . Not on file  Social Needs  . Financial resource strain: Not on file  . Food insecurity    Worry: Not on file    Inability: Not on file  . Transportation needs    Medical: Not on file    Non-medical: Not on file  Tobacco Use  . Smoking status: Passive Smoke Exposure - Never Smoker  . Smokeless  tobacco: Never Used  Substance and Sexual Activity  . Alcohol use: No  . Drug use: No  . Sexual activity: Yes  Lifestyle  . Physical activity    Days per week: 0 days    Minutes per session: Not on file  . Stress: Not on file  Relationships  . Social Musician on phone: Not on file    Gets together: Not on file    Attends religious service: Not on file    Active member of club or organization: Not on file    Attends meetings of clubs or organizations: Not on file    Relationship status: Not on file  . Intimate partner violence    Fear of current or ex partner: Not on file    Emotionally abused: Not on file    Physically abused: Not on file    Forced sexual activity: Not on file  Other Topics Concern  . Not on file  Social History Narrative   ** Merged History Encounter **       Lives with Mother, [name].  No siblings.  1 cat.  Stepfather smokes outside.    Current Outpatient Medications on File Prior to Visit  Medication Sig Dispense Refill  . amoxicillin (AMOXIL) 500 MG tablet Take 500 mg by mouth 2 (two) times daily. Take 1  tablet BID x 10 days    . clobetasol cream (TEMOVATE) 4.69 % Apply 1 application topically. Apply topically to affected areas once per week prn    . hydrOXYzine (ATARAX/VISTARIL) 50 MG tablet Take 50 mg by mouth 3 (three) times daily as needed. Take 1/2 to 1 tablet TID prn anxiety    . ibuprofen (ADVIL) 600 MG tablet Take 600 mg by mouth every 6 (six) hours as needed.    . Multiple Vitamins-Minerals (HAIR VITAMINS PO) Take 1 capsule by mouth daily.    Marland Kitchen topiramate (TOPAMAX) 50 MG tablet Take 50 mg by mouth. Take 1/2 to 1 tablet at bedtime for migraine     No current facility-administered medications on file prior to visit.     No Known Allergies   BP 104/70 (BP Location: Right Arm, Patient Position: Sitting, Cuff Size: Large)   Pulse (!) 108   Ht 5\' 3"  (1.6 m)   Wt 169 lb 3.2 oz (76.7 kg)   SpO2 100%   BMI 29.97 kg/m   Review of  Systems denies polyuria, loss of smell, syncope, visual loss, galactorrhea, easy bruising, change in facial appearance, rhinorrhea, and n/v.   She has eczema and depression.       Objective:   Physical Exam VS: see vs page GEN: no distress HEAD: head: no deformity eyes: no periorbital swelling, no proptosis external nose and ears are normal NECK: supple, thyroid is not enlarged CHEST WALL: no deformity LUNGS: clear to auscultation CV: reg rate and rhythm, no murmur ABD: abdomen is soft, nontender.  no hepatosplenomegaly.  not distended.  no hernia MUSCULOSKELETAL: muscle bulk and strength are grossly normal.  no obvious joint swelling.  gait is normal and steady.  EXTEMITIES: no deformity.  no edema PULSES: no carotid bruit NEURO:  cn 2-12 grossly intact.   readily moves all 4's.  sensation is intact to touch on all 4's.  SKIN:  Normal texture and temperature.  No rash or suspicious lesion is visible.  No terminal hair on the face.  NODES:  None palpable at the neck PSYCH: alert, well-oriented.  Does not appear anxious nor depressed.   Lab Results  Component Value Date   TSH 2.930 08/14/2019   CT (2020): normal  MRI (2019): no mention is made of the pituitary.   I have reviewed outside records, and summarized: Pt was noted to have elevated prolactin, and referred here.  Pt was eval for secondary amenorrhea, and [place] was done     Assessment & Plan:  Hyperprolactinemia, new to me, poss due to topamax Headache: she does not need to d/c topamax.   Patient Instructions  Please start taking "bromocriptine," to lower the prolactin. It has possible side effects of nausea and dizziness.  These go away with time.  You can avoid these by taking it at bedtime, and by taking just take 1/2 pill for the first week.  Please note this medication will increase your chances of a pregnancy.  Please redo the blood test in 1 month.  Please do the our [place] office.  Please call ahead, so they  can get you in and out fast.   Please come back for a follow-up appointment in 6 months.

## 2019-09-13 NOTE — Addendum Note (Signed)
Addended by: Renato Shin on: 09/13/2019 03:56 PM   Modules accepted: Level of Service

## 2019-10-05 ENCOUNTER — Encounter: Payer: Self-pay | Admitting: Endocrinology

## 2019-10-06 ENCOUNTER — Other Ambulatory Visit: Payer: Self-pay | Admitting: Endocrinology

## 2019-10-06 ENCOUNTER — Ambulatory Visit: Payer: BC Managed Care – PPO | Admitting: Obstetrics and Gynecology

## 2019-10-06 MED ORDER — CABERGOLINE 0.5 MG PO TABS: 0.2500 mg | ORAL_TABLET | ORAL | 5 refills | Status: DC

## 2019-11-19 ENCOUNTER — Ambulatory Visit: Payer: BC Managed Care – PPO | Admitting: Obstetrics and Gynecology

## 2019-11-24 DIAGNOSIS — F329 Major depressive disorder, single episode, unspecified: Secondary | ICD-10-CM | POA: Diagnosis not present

## 2019-11-24 DIAGNOSIS — E221 Hyperprolactinemia: Secondary | ICD-10-CM | POA: Diagnosis not present

## 2019-11-24 DIAGNOSIS — G43009 Migraine without aura, not intractable, without status migrainosus: Secondary | ICD-10-CM | POA: Diagnosis not present

## 2019-11-24 DIAGNOSIS — R1110 Vomiting, unspecified: Secondary | ICD-10-CM | POA: Diagnosis not present

## 2019-11-28 ENCOUNTER — Ambulatory Visit (INDEPENDENT_AMBULATORY_CARE_PROVIDER_SITE_OTHER): Payer: BC Managed Care – PPO | Admitting: Obstetrics and Gynecology

## 2019-11-28 ENCOUNTER — Other Ambulatory Visit (HOSPITAL_COMMUNITY)
Admission: RE | Admit: 2019-11-28 | Discharge: 2019-11-28 | Disposition: A | Discharge status: Home / Self Care | Payer: BC Managed Care – PPO | Source: Ambulatory Visit | Attending: Obstetrics and Gynecology | Admitting: Obstetrics and Gynecology

## 2019-11-28 ENCOUNTER — Telehealth: Payer: Self-pay | Admitting: Endocrinology

## 2019-11-28 ENCOUNTER — Encounter: Payer: Self-pay | Admitting: Obstetrics and Gynecology

## 2019-11-28 ENCOUNTER — Other Ambulatory Visit: Payer: Self-pay

## 2019-11-28 VITALS — BP 122/74 | Ht 62.0 in | Wt 167.0 lb

## 2019-11-28 DIAGNOSIS — Z1331 Encounter for screening for depression: Secondary | ICD-10-CM

## 2019-11-28 DIAGNOSIS — Z1339 Encounter for screening examination for other mental health and behavioral disorders: Secondary | ICD-10-CM

## 2019-11-28 DIAGNOSIS — Z01419 Encounter for gynecological examination (general) (routine) without abnormal findings: Secondary | ICD-10-CM

## 2019-11-28 DIAGNOSIS — Z113 Encounter for screening for infections with a predominantly sexual mode of transmission: Secondary | ICD-10-CM | POA: Diagnosis not present

## 2019-11-28 DIAGNOSIS — E221 Hyperprolactinemia: Secondary | ICD-10-CM

## 2019-11-28 NOTE — Progress Notes (Signed)
Gynecology Annual Exam  PCP: Mckenzie Peak, PA-C  Chief Complaint  Patient presents with  . Annual Exam   History of Present Illness:  [name] is a 19 y.o. G1P0010 who LMP was No LMP recorded. (Menstrual status: Irregular Periods)., presents today for her annual examination.  Her periods are still very light. She'll have 2-3 days that are very light, then 2 months without a period.  She started taking cabergoline for hyperprolactinemia. She is seeing an endocrinologist in [place] for this.  She is currently not taking anything for contraception.    She is sexually active. She is using condoms for contraception. No issues with intercourse.   Last Pap: n/a Hx of STDs: none  There is no FH of breast cancer. There is no FH of ovarian cancer. The patient does not do self-breast exams.  Tobacco use: The patient denies current or previous tobacco use. Alcohol use: none Exercise: trying to go more often  The patient wears seatbelts: yes.   The patient reports that domestic violence in her life is absent.   Past Medical History:  Diagnosis Date  . 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 Major depressive disorder   . Suicide attempt by drug ingestion [facility])     Past Surgical History:  Procedure Laterality Date  . DILATION AND EVACUATION N/A 09/03/2015   Procedure: DILATATION AND EVACUATION;  Surgeon: Vena Austria, MD;  Location: [facility] ORS;  Service: Gynecology;  Laterality: N/A;  . dnc    . TONSILLECTOMY N/A 04/25/2017   Procedure: TONSILLECTOMY;  Surgeon: Bud Face, MD;  Location: [facility];  Service: ENT;  Laterality: N/A;  . TONSILLECTOMY    . WISDOM TOOTH EXTRACTION      Prior to Admission medications   Medication Sig Start Date End Date Taking? Authorizing Provider  cabergoline (DOSTINEX) 0.5 MG tablet Take 0.5 tablets (0.25 mg total) by mouth 2 (two) times a week. 10/06/19  Yes Romero Belling, MD  clobetasol cream (TEMOVATE) 0.05 % Apply 1  application topically. Apply topically to affected areas once per week prn   Yes [provider]  hydrOXYzine (ATARAX/VISTARIL) 50 MG tablet Take 50 mg by mouth 3 (three) times daily as needed. Take 1/2 to 1 tablet TID prn anxiety   Yes [provider]  ibuprofen (ADVIL) 600 MG tablet Take 600 mg by mouth every 6 (six) hours as needed.   Yes [provider]  Multiple Vitamins-Minerals (HAIR VITAMINS PO) Take 1 capsule by mouth daily.   Yes [provider]  sertraline (ZOLOFT) 50 MG tablet Take 50 mg by mouth daily. 11/24/19  Yes [provider]  topiramate (TOPAMAX) 50 MG tablet Take 50 mg by mouth. Take 1/2 to 1 tablet at bedtime for migraine   Yes [provider]    No Known Allergies   Obstetric History: G1P0010  Social History   Socioeconomic History  . Marital status: Single    Spouse name: Not on file  . Number of children: Not on file  . Years of education: Not on file  . Highest education level: Not on file  Occupational History  . Not on file  Tobacco Use  . Smoking status: Passive Smoke Exposure - Never Smoker  . Smokeless tobacco: Never Used  Substance and Sexual Activity  . Alcohol use: No  . Drug use: No  . Sexual activity: Yes    Birth control/protection: None  Other Topics Concern  . Not on file  Social History Narrative   **  Merged History Encounter **       Lives with Mother, Mckenzie Brown.  No siblings.  1 cat.  Stepfather smokes outside.   Social Determinants of Health   Financial Resource Strain:   . Difficulty of Paying Living Expenses: Not on file  Food Insecurity:   . Worried About Charity fundraiser in the Last Year: Not on file  . Ran Out of Food in the Last Year: Not on file  Transportation Needs:   . Lack of Transportation (Medical): Not on file  . Lack of Transportation (Non-Medical): Not on file  Physical Activity:   . Days of Exercise per Week: Not on file  . Minutes of Exercise per  Session: Not on file  Stress:   . Feeling of Stress : Not on file  Social Connections:   . Frequency of Communication with Friends and Family: Not on file  . Frequency of Social Gatherings with Friends and Family: Not on file  . Attends Religious Services: Not on file  . Active Member of Clubs or Organizations: Not on file  . Attends Archivist Meetings: Not on file  . Marital Status: Not on file  Intimate Partner Violence:   . Fear of Current or Ex-Partner: Not on file  . Emotionally Abused: Not on file  . Physically Abused: Not on file  . Sexually Abused: Not on file    Family History  Problem Relation Age of Onset  . Hypertension Mother   . Anxiety disorder Mother   . Depression Mother   . Diabetes Maternal Grandmother   . Hypertension Maternal Grandmother   . Diabetes Maternal Grandfather   . Parkinson's disease Maternal Grandfather   . Other Neg Hx        pituitary disorder    Review of Systems  Constitutional: Negative.   HENT: Negative.   Eyes: Negative.   Respiratory: Negative.   Cardiovascular: Negative.   Gastrointestinal: Negative.        Decrease in appetite  Genitourinary: Negative.   Musculoskeletal: Negative.   Skin: Positive for itching. Negative for rash.       Breast tenderness  Neurological: Positive for headaches. Negative for dizziness, tingling, tremors, sensory change, speech change, focal weakness, seizures, loss of consciousness and weakness.  Psychiatric/Behavioral: Positive for depression. Negative for hallucinations, memory loss, substance abuse and suicidal ideas. The patient is nervous/anxious. The patient does not have insomnia.      Physical Exam BP 122/74   Ht 5\' 2"  (1.575 m)   Wt 167 lb (75.8 kg)   BMI 30.54 kg/m    Physical Exam Constitutional:      General: She is not in acute distress.    Appearance: Normal appearance. She is well-developed.  Genitourinary:     Pelvic exam was performed with patient in the  lithotomy position.     Vulva, inguinal canal, urethra, bladder, vagina, uterus, right adnexa and left adnexa normal.     No posterior fourchette tenderness, injury or lesion present.     No inguinal adenopathy present in the right or left side.    No signs of injury in the vagina.     No vaginal discharge, erythema, tenderness or bleeding.     No cervical motion tenderness, discharge, friability, lesion, bleeding or polyp.     Uterus is mobile.     Uterus is not enlarged or tender.     No uterine mass detected.    Uterus is anteverted.     No  right or left adnexal mass present.     Right adnexa not tender or full.     Left adnexa not tender or full.  HENT:     Head: Normocephalic and atraumatic.  Eyes:     General: No scleral icterus.    Conjunctiva/sclera: Conjunctivae normal.  Neck:     Thyroid: No thyromegaly.  Cardiovascular:     Rate and Rhythm: Normal rate and regular rhythm.     Heart sounds: No murmur. No friction rub. No gallop.   Pulmonary:     Effort: Pulmonary effort is normal. No respiratory distress.     Breath sounds: Normal breath sounds. No wheezing or rales.  Chest:     Breasts:        Right: No inverted nipple, mass, nipple discharge, skin change or tenderness.        Left: No inverted nipple, mass, nipple discharge, skin change or tenderness.  Abdominal:     General: Bowel sounds are normal. There is no distension.     Palpations: Abdomen is soft. There is no mass.     Tenderness: There is no abdominal tenderness. There is no guarding or rebound.  Musculoskeletal:        General: No swelling or tenderness. Normal range of motion.     Cervical back: Normal range of motion and neck supple.  Lymphadenopathy:     Cervical: No cervical adenopathy.     Lower Body: No right inguinal adenopathy. No left inguinal adenopathy.  Neurological:     General: No focal deficit present.     Mental Status: She is alert and oriented to person, place, and time.     Cranial  Nerves: No cranial nerve deficit.  Skin:    General: Skin is warm and dry.     Findings: No erythema or rash.  Psychiatric:        Mood and Affect: Mood normal.        Behavior: Behavior normal.        Judgment: Judgment normal.     Female chaperone present for pelvic and breast  portions of the physical exam  Results: AUDIT Questionnaire (screen for alcoholism): 0 PHQ-9: 19 (followed Dr. Harrold Donath with [facility] in [place])   Assessment: 19 y.o. G1P0010 female here for routine annual gynecologic examination  Plan: Problem List Items Addressed This Visit    None    Visit Diagnoses    Women's annual routine gynecological examination    -  Primary   Relevant Orders   Cervicovaginal ancillary only   Screening for depression       Screening for alcoholism       Screen for STD (sexually transmitted disease)       Relevant Orders   Cervicovaginal ancillary only      Screening: -- Blood pressure screen normal -- Weight screening: overweight: continue to monitor -- Depression screening negative (PHQ-9) -- Nutrition: normal -- cholesterol screening: not due for screening -- osteoporosis screening: not due -- tobacco screening: not using -- alcohol screening: AUDIT questionnaire indicates low-risk usage. -- family history of breast cancer screening: done. not at high risk. -- no evidence of domestic violence or intimate partner violence. -- STD screening: gonorrhea/chlamydia NAAT collected -- pap smear not collected per ASCCP guidelines -- flu vaccine received -- HPV vaccination series: received  Thomasene Mohair, MD 11/28/2019 4:09 PM

## 2019-11-28 NOTE — Telephone Encounter (Signed)
Called pt and apologized for confusion. In light of pandemic, restrictions have been put in place that prevented pt from having her labs completed at [facility] Pt expressed understanding. Asked for orders to be faxed to [facility]; [address] [facility] [facility] Forwarding this message to [place], lab tech so orders can be faxed.

## 2019-11-28 NOTE — Telephone Encounter (Signed)
Patient requests to be called at ph# [phone] re: patient called and stated she called [facility] office to schedule blood work (as instructed per [facility]) and they told her she was not able to go there or schedule with them since she is not THEIR patient.

## 2019-11-28 NOTE — Addendum Note (Signed)
Addended by: Adline Mango I on: 11/28/2019 04:20 PM   Modules accepted: Orders

## 2019-12-02 LAB — CERVICOVAGINAL ANCILLARY ONLY
Chlamydia: NEGATIVE
Comment: NEGATIVE
Comment: NEGATIVE
Comment: NORMAL
Neisseria Gonorrhea: NEGATIVE
Trichomonas: NEGATIVE

## 2019-12-17 DIAGNOSIS — Z20822 Contact with and (suspected) exposure to covid-19: Secondary | ICD-10-CM | POA: Diagnosis not present

## 2019-12-23 NOTE — Telephone Encounter (Signed)
Mirena rcvd/charged 05/31/18 

## 2020-01-22 ENCOUNTER — Other Ambulatory Visit: Payer: Self-pay | Admitting: Endocrinology

## 2020-01-22 ENCOUNTER — Encounter: Payer: Self-pay | Admitting: Endocrinology

## 2020-01-22 DIAGNOSIS — E221 Hyperprolactinemia: Secondary | ICD-10-CM

## 2020-01-23 ENCOUNTER — Other Ambulatory Visit: Payer: Self-pay

## 2020-01-23 DIAGNOSIS — E221 Hyperprolactinemia: Secondary | ICD-10-CM

## 2020-01-23 NOTE — Telephone Encounter (Signed)
Please fax orders to the address mentioned below.

## 2020-02-03 ENCOUNTER — Encounter: Payer: Self-pay | Admitting: Endocrinology

## 2020-02-03 NOTE — Telephone Encounter (Signed)
I know you had faxed her orders to the requested site. Are you able to call [facility] to inquire about this possible miscommunication?

## 2020-02-10 DIAGNOSIS — E221 Hyperprolactinemia: Secondary | ICD-10-CM | POA: Diagnosis not present

## 2020-02-11 LAB — PROLACTIN: Prolactin: 11.8 ng/mL (ref 4.8–23.3)

## 2020-02-20 DIAGNOSIS — Z6828 Body mass index (BMI) 28.0-28.9, adult: Secondary | ICD-10-CM | POA: Diagnosis not present

## 2020-02-20 DIAGNOSIS — F418 Other specified anxiety disorders: Secondary | ICD-10-CM | POA: Diagnosis not present

## 2020-02-20 DIAGNOSIS — G43009 Migraine without aura, not intractable, without status migrainosus: Secondary | ICD-10-CM | POA: Diagnosis not present

## 2020-03-06 DIAGNOSIS — R05 Cough: Secondary | ICD-10-CM | POA: Diagnosis not present

## 2020-03-06 DIAGNOSIS — Z1152 Encounter for screening for COVID-19: Secondary | ICD-10-CM | POA: Diagnosis not present

## 2020-03-09 ENCOUNTER — Other Ambulatory Visit: Payer: Self-pay

## 2020-03-11 ENCOUNTER — Telehealth (INDEPENDENT_AMBULATORY_CARE_PROVIDER_SITE_OTHER): Payer: BC Managed Care – PPO | Admitting: Endocrinology

## 2020-03-11 ENCOUNTER — Other Ambulatory Visit: Payer: Self-pay

## 2020-03-11 DIAGNOSIS — E221 Hyperprolactinemia: Secondary | ICD-10-CM

## 2020-03-11 NOTE — Patient Instructions (Addendum)
Please continue the same medication.   Please come back for a follow-up appointment in 4-6 months.

## 2020-03-11 NOTE — Progress Notes (Signed)
Subjective:    Patient ID: Mckenzie Brown, female    DOB: 2001-09-30, 19 y.o.   MRN: [id number]  HPI telehealth visit today via video visit.  Alternatives to telehealth are presented to this patient, and the patient agrees to the telehealth visit.  Pt is advised of the cost of the visit, and agrees to this, also.   Patient is at home, and I am at the office.   Persons attending the telehealth visit: the patient and I Pt returns for f/u of hyperprolactinemia (dx'ed 2018; no cause was found; she is G1P0 (miscarriage); she does not want a pregnancy now, but would like to preserve fertility for the future.  She took oral contraceptives 2013-2018.  She did not tolerate Mirena device (headache). Menses stopped in mid-2020.   Parlodel caused worsening of acne).   She has irreg menses.  Denies galactorrhea.  Acne is better since change to cabergoline.  She has lost a few lbs--intentional.   Past Medical History:  Diagnosis Date  . 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 Major depressive disorder   . Suicide attempt by drug ingestion [facility])     Past Surgical History:  Procedure Laterality Date  . DILATION AND EVACUATION N/A 09/03/2015   Procedure: DILATATION AND EVACUATION;  Surgeon: Malachy Mood, MD;  Location: [facility];  Service: Gynecology;  Laterality: N/A;  . dnc    . TONSILLECTOMY N/A 04/25/2017   Procedure: TONSILLECTOMY;  Surgeon: Carloyn Manner, MD;  Location: [facility];  Service: ENT;  Laterality: N/A;  . TONSILLECTOMY    . WISDOM TOOTH EXTRACTION      Social History   Socioeconomic History  . Marital status: Single    Spouse name: Not on file  . Number of children: Not on file  . Years of education: Not on file  . Highest education level: Not on file  Occupational History  . Not on file  Tobacco Use  . Smoking status: Passive Smoke Exposure - Never Smoker  . Smokeless tobacco: Never Used  Substance and Sexual Activity  . Alcohol use: No  . Drug  use: No  . Sexual activity: Yes    Birth control/protection: None  Other Topics Concern  . Not on file  Social History Narrative   ** Merged History Encounter **       Lives with Mother, Ronda Fairly.  No siblings.  1 cat.  Stepfather smokes outside.   Social Determinants of Health   Financial Resource Strain:   . Difficulty of Paying Living Expenses:   Food Insecurity:   . Worried About Charity fundraiser in the Last Year:   . Arboriculturist in the Last Year:   Transportation Needs:   . Film/video editor (Medical):   Marland Kitchen Lack of Transportation (Non-Medical):   Physical Activity:   . Days of Exercise per Week:   . Minutes of Exercise per Session:   Stress:   . Feeling of Stress :   Social Connections:   . Frequency of Communication with Friends and Family:   . Frequency of Social Gatherings with Friends and Family:   . Attends Religious Services:   . Active Member of Clubs or Organizations:   . Attends Archivist Meetings:   Marland Kitchen Marital Status:   Intimate Partner Violence:   . Fear of Current or Ex-Partner:   . Emotionally Abused:   Marland Kitchen Physically Abused:   . Sexually Abused:     Current Outpatient Medications on  File Prior to Visit  Medication Sig Dispense Refill  . cabergoline (DOSTINEX) 0.5 MG tablet Take 0.5 tablets (0.25 mg total) by mouth 2 (two) times a week. 10 tablet 5  . clobetasol cream (TEMOVATE) 0.05 % Apply 1 application topically. Apply topically to affected areas once per week prn    . hydrOXYzine (ATARAX/VISTARIL) 50 MG tablet Take 50 mg by mouth 3 (three) times daily as needed. Take 1/2 to 1 tablet TID prn anxiety    . ibuprofen (ADVIL) 600 MG tablet Take 600 mg by mouth every 6 (six) hours as needed.    . Multiple Vitamins-Minerals (HAIR VITAMINS PO) Take 1 capsule by mouth daily.    . sertraline (ZOLOFT) 50 MG tablet Take 50 mg by mouth daily.    Marland Kitchen topiramate (TOPAMAX) 50 MG tablet Take 50 mg by mouth. Take 1/2 to 1 tablet at bedtime for  migraine     No current facility-administered medications on file prior to visit.    No Known Allergies  Family History  Problem Relation Age of Onset  . Hypertension Mother   . Anxiety disorder Mother   . Depression Mother   . Diabetes Maternal Grandmother   . Hypertension Maternal Grandmother   . Diabetes Maternal Grandfather   . Parkinson's disease Maternal Grandfather   . Other Neg Hx        pituitary disorder    There were no vitals taken for this visit.  Review of Systems Denies nausea.    Objective:   Physical Exam   Prolactin=17    Assessment & Plan:  Hyperprolactinemia: well-controlled irreg menses, persistent.  I offered to increase cabergoline, to see if this would help, but she declines  Patient Instructions  Please continue the same medication.   Please come back for a follow-up appointment in 4-6 months.

## 2020-03-12 DIAGNOSIS — Z1152 Encounter for screening for COVID-19: Secondary | ICD-10-CM | POA: Diagnosis not present

## 2020-03-12 DIAGNOSIS — J069 Acute upper respiratory infection, unspecified: Secondary | ICD-10-CM | POA: Diagnosis not present

## 2020-03-19 DIAGNOSIS — R05 Cough: Secondary | ICD-10-CM | POA: Diagnosis not present

## 2020-03-19 DIAGNOSIS — F418 Other specified anxiety disorders: Secondary | ICD-10-CM | POA: Diagnosis not present

## 2020-03-19 DIAGNOSIS — G43009 Migraine without aura, not intractable, without status migrainosus: Secondary | ICD-10-CM | POA: Diagnosis not present

## 2020-04-02 ENCOUNTER — Encounter: Payer: Self-pay | Admitting: Endocrinology

## 2020-04-14 DIAGNOSIS — R1110 Vomiting, unspecified: Secondary | ICD-10-CM | POA: Diagnosis not present

## 2020-04-14 DIAGNOSIS — R42 Dizziness and giddiness: Secondary | ICD-10-CM | POA: Diagnosis not present

## 2020-04-14 DIAGNOSIS — R0789 Other chest pain: Secondary | ICD-10-CM | POA: Diagnosis not present

## 2020-04-14 DIAGNOSIS — R0602 Shortness of breath: Secondary | ICD-10-CM | POA: Diagnosis not present

## 2020-04-14 DIAGNOSIS — R61 Generalized hyperhidrosis: Secondary | ICD-10-CM | POA: Diagnosis not present

## 2020-04-14 DIAGNOSIS — R202 Paresthesia of skin: Secondary | ICD-10-CM | POA: Diagnosis not present

## 2020-04-14 DIAGNOSIS — R079 Chest pain, unspecified: Secondary | ICD-10-CM | POA: Diagnosis not present

## 2020-04-14 DIAGNOSIS — R110 Nausea: Secondary | ICD-10-CM | POA: Diagnosis not present

## 2020-04-14 DIAGNOSIS — M7989 Other specified soft tissue disorders: Secondary | ICD-10-CM | POA: Diagnosis not present

## 2020-04-14 DIAGNOSIS — R072 Precordial pain: Secondary | ICD-10-CM | POA: Diagnosis not present

## 2020-04-14 DIAGNOSIS — F439 Reaction to severe stress, unspecified: Secondary | ICD-10-CM | POA: Diagnosis not present

## 2020-04-16 DIAGNOSIS — R0789 Other chest pain: Secondary | ICD-10-CM | POA: Diagnosis not present

## 2020-04-16 DIAGNOSIS — Z6828 Body mass index (BMI) 28.0-28.9, adult: Secondary | ICD-10-CM | POA: Diagnosis not present

## 2020-04-16 DIAGNOSIS — F419 Anxiety disorder, unspecified: Secondary | ICD-10-CM | POA: Diagnosis not present

## 2020-05-18 ENCOUNTER — Encounter: Payer: Self-pay | Admitting: Endocrinology

## 2020-08-26 DIAGNOSIS — G43009 Migraine without aura, not intractable, without status migrainosus: Secondary | ICD-10-CM | POA: Diagnosis not present

## 2020-08-26 DIAGNOSIS — Z23 Encounter for immunization: Secondary | ICD-10-CM | POA: Diagnosis not present

## 2020-08-26 DIAGNOSIS — R635 Abnormal weight gain: Secondary | ICD-10-CM | POA: Diagnosis not present

## 2020-08-26 DIAGNOSIS — F419 Anxiety disorder, unspecified: Secondary | ICD-10-CM | POA: Diagnosis not present

## 2020-10-19 DIAGNOSIS — G43009 Migraine without aura, not intractable, without status migrainosus: Secondary | ICD-10-CM | POA: Diagnosis not present

## 2020-10-19 DIAGNOSIS — F32A Depression, unspecified: Secondary | ICD-10-CM | POA: Diagnosis not present

## 2020-10-19 DIAGNOSIS — F419 Anxiety disorder, unspecified: Secondary | ICD-10-CM | POA: Diagnosis not present

## 2020-10-19 DIAGNOSIS — Z6829 Body mass index (BMI) 29.0-29.9, adult: Secondary | ICD-10-CM | POA: Diagnosis not present

## 2020-10-24 DIAGNOSIS — L508 Other urticaria: Secondary | ICD-10-CM | POA: Diagnosis not present

## 2020-10-25 ENCOUNTER — Encounter: Payer: Self-pay | Admitting: Emergency Medicine

## 2020-10-25 ENCOUNTER — Other Ambulatory Visit: Payer: Self-pay

## 2020-10-25 ENCOUNTER — Emergency Department
Admission: EM | Admit: 2020-10-25 | Discharge: 2020-10-25 | Disposition: A | Discharge status: Home / Self Care | Payer: BC Managed Care – PPO | Attending: Emergency Medicine | Admitting: Emergency Medicine

## 2020-10-25 ENCOUNTER — Emergency Department: Payer: BC Managed Care – PPO

## 2020-10-25 DIAGNOSIS — S161XXA Strain of muscle, fascia and tendon at neck level, initial encounter: Secondary | ICD-10-CM | POA: Insufficient documentation

## 2020-10-25 DIAGNOSIS — S8002XA Contusion of left knee, initial encounter: Secondary | ICD-10-CM

## 2020-10-25 DIAGNOSIS — R519 Headache, unspecified: Secondary | ICD-10-CM | POA: Diagnosis not present

## 2020-10-25 DIAGNOSIS — M25562 Pain in left knee: Secondary | ICD-10-CM | POA: Diagnosis not present

## 2020-10-25 DIAGNOSIS — Z7722 Contact with and (suspected) exposure to environmental tobacco smoke (acute) (chronic): Secondary | ICD-10-CM | POA: Diagnosis not present

## 2020-10-25 DIAGNOSIS — S0990XA Unspecified injury of head, initial encounter: Secondary | ICD-10-CM | POA: Diagnosis not present

## 2020-10-25 DIAGNOSIS — S0003XA Contusion of scalp, initial encounter: Secondary | ICD-10-CM | POA: Diagnosis not present

## 2020-10-25 DIAGNOSIS — M542 Cervicalgia: Secondary | ICD-10-CM | POA: Diagnosis not present

## 2020-10-25 DIAGNOSIS — Y92410 Unspecified street and highway as the place of occurrence of the external cause: Secondary | ICD-10-CM | POA: Diagnosis not present

## 2020-10-25 HISTORY — DX: Migraine, unspecified, not intractable, without status migrainosus: G43.909

## 2020-10-25 NOTE — Discharge Instructions (Signed)
Follow-up with your primary care provider if any continued problems or concerns.  Ice or heat to your muscles as needed for discomfort.  You may take Tylenol or ibuprofen as needed for muscle aches.  Be aware that you will be more sore tomorrow than you are currently as your muscles will get stiff.  Taking warm showers will also help with muscle stiffness.
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
## 2020-11-29 ENCOUNTER — Ambulatory Visit: Payer: BC Managed Care – PPO | Admitting: Obstetrics and Gynecology

## 2020-12-27 ENCOUNTER — Other Ambulatory Visit (HOSPITAL_COMMUNITY)
Admission: RE | Admit: 2020-12-27 | Discharge: 2020-12-27 | Disposition: A | Discharge status: Home / Self Care | Payer: BC Managed Care – PPO | Source: Ambulatory Visit | Attending: Obstetrics and Gynecology | Admitting: Obstetrics and Gynecology

## 2020-12-27 ENCOUNTER — Other Ambulatory Visit: Payer: Self-pay

## 2020-12-27 ENCOUNTER — Ambulatory Visit (INDEPENDENT_AMBULATORY_CARE_PROVIDER_SITE_OTHER): Payer: BC Managed Care – PPO | Admitting: Obstetrics and Gynecology

## 2020-12-27 ENCOUNTER — Encounter: Payer: Self-pay | Admitting: Obstetrics and Gynecology

## 2020-12-27 VITALS — BP 122/70 | Wt 173.0 lb

## 2020-12-27 DIAGNOSIS — Z113 Encounter for screening for infections with a predominantly sexual mode of transmission: Secondary | ICD-10-CM | POA: Diagnosis not present

## 2020-12-27 DIAGNOSIS — Z124 Encounter for screening for malignant neoplasm of cervix: Secondary | ICD-10-CM | POA: Diagnosis not present

## 2020-12-27 DIAGNOSIS — N914 Secondary oligomenorrhea: Secondary | ICD-10-CM | POA: Diagnosis not present

## 2020-12-27 DIAGNOSIS — E221 Hyperprolactinemia: Secondary | ICD-10-CM | POA: Diagnosis not present

## 2020-12-27 NOTE — Progress Notes (Signed)
Obstetrics & Gynecology Office Visit   Chief Complaint  Patient presents with  . Menstrual Problem    Pt having irregular cycles. Has not had one since December.    History of Present Illness: 20 y.o. G69P0010 female with a history of hyperpolactinemia who presents for irregular menses.  She was taking cabergoline and her prolactin level came back to normal.  So she asked if she could stop her medication, due to side effects.  Her last prolactin, she states, was in August.  She has had irregular periods.  Mostly, she has infrequent periods. She has gained about 35 pounds in the past year, unintentionally.  Her last period was in December and it was very light. Prior to that her period was in November, and previous to that was July.   She notes no galactorrhea. She has a history of migraines and has had some of those.  She notes no vision changes.  She has had an MRI and had no pituitary adenomas.   She denies new hair growth or thick hair.  Denies acne apart from around her mask.   Past Medical History:  Diagnosis Date  . 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 Major depressive disorder   . Migraines   . Suicide attempt by drug ingestion [facility])    Past Surgical History:  Procedure Laterality Date  . DILATION AND EVACUATION N/A 09/03/2015   Procedure: DILATATION AND EVACUATION;  Surgeon: Vena Austria, MD;  Location: [facility] ORS;  Service: Gynecology;  Laterality: N/A;  . dnc    . TONSILLECTOMY N/A 04/25/2017   Procedure: TONSILLECTOMY;  Surgeon: Bud Face, MD;  Location: [facility];  Service: ENT;  Laterality: N/A;  . TONSILLECTOMY    . WISDOM TOOTH EXTRACTION      Gynecologic History: Patient's last menstrual period was 10/08/2020.  Obstetric History: G1P0010  Family History  Problem Relation Age of Onset  . Hypertension Mother   . Anxiety disorder Mother   . Depression Mother   . Diabetes Maternal Grandmother   . Hypertension Maternal Grandmother   .  Diabetes Maternal Grandfather   . Parkinson's disease Maternal Grandfather   . Other Neg Hx        pituitary disorder    Social History   Socioeconomic History  . Marital status: Single    Spouse name: Not on file  . Number of children: Not on file  . Years of education: Not on file  . Highest education level: Not on file  Occupational History  . Not on file  Tobacco Use  . Smoking status: Passive Smoke Exposure - Never Smoker  . Smokeless tobacco: Never Used  Vaping Use  . Vaping Use: Never used  Substance and Sexual Activity  . Alcohol use: No  . Drug use: No  . Sexual activity: Yes    Birth control/protection: Condom  Other Topics Concern  . Not on file  Social History Narrative   ** Merged History Encounter **       Lives with Mother, [name].  No siblings.  1 cat.  Stepfather smokes outside.   Social Determinants of Health   Financial Resource Strain: Not on file  Food Insecurity: Not on file  Transportation Needs: Not on file  Physical Activity: Not on file  Stress: Not on file  Social Connections: Not on file  Intimate Partner Violence: Not on file    No Known Allergies  Prior to Admission medications: Denies   Review of Systems  Constitutional: Negative.  HENT: Negative.   Eyes: Negative.   Respiratory: Negative.   Cardiovascular: Negative.   Gastrointestinal: Negative.   Genitourinary: Negative.   Musculoskeletal: Negative.   Skin: Negative.   Neurological: Negative.   Psychiatric/Behavioral: Negative.      Physical Exam BP 122/70   Wt 173 lb (78.5 kg)   LMP 10/08/2020   BMI 30.65 kg/m  Patient's last menstrual period was 10/08/2020. Physical Exam Constitutional:      General: She is not in acute distress.    Appearance: Normal appearance. She is well-developed.  Genitourinary:     Vulva, bladder and urethral meatus normal.     Right Labia: No rash, tenderness, lesions, skin changes or Bartholin's cyst.    Left Labia: No tenderness,  skin changes, Bartholin's cyst or rash.    No inguinal adenopathy present in the right or left side.    Pelvic Tanner Score: 5/5.     Right Adnexa: not tender, not full and no mass present.    Left Adnexa: not tender, not full and no mass present.    No cervical motion tenderness, friability, lesion or polyp.     Uterus is not enlarged, fixed or tender.     Uterus is anteverted.     No urethral tenderness or mass present.     Pelvic exam was performed with patient in the lithotomy position.  HENT:     Head: Normocephalic and atraumatic.  Eyes:     General: Lids are normal. Lids are everted, no foreign bodies appreciated. No scleral icterus.    Conjunctiva/sclera: Conjunctivae normal.     Visual Fields:     Right eye: DM in the upper temporal quadrant. DM in the lower temporal quadrant.     Left eye: DM in the upper temporal quadrant. DM in the lower temporal quadrant.  Neck:     Thyroid: No thyroid mass, thyromegaly or thyroid tenderness.  Cardiovascular:     Rate and Rhythm: Normal rate and regular rhythm.     Heart sounds: No murmur heard. No friction rub. No gallop.   Pulmonary:     Effort: Pulmonary effort is normal. No respiratory distress.     Breath sounds: Normal breath sounds. No wheezing or rales.  Abdominal:     General: Bowel sounds are normal. There is no distension.     Palpations: Abdomen is soft. There is no mass.     Tenderness: There is no abdominal tenderness. There is no guarding or rebound.     Hernia: There is no hernia in the left inguinal area or right inguinal area.  Musculoskeletal:        General: Normal range of motion.     Cervical back: Normal range of motion and neck supple.  Lymphadenopathy:     Lower Body: No right inguinal adenopathy. No left inguinal adenopathy.  Neurological:     General: No focal deficit present.     Mental Status: She is alert and oriented to person, place, and time.     Cranial Nerves: No cranial nerve deficit.  Skin:     General: Skin is warm and dry.     Findings: No erythema.  Psychiatric:        Mood and Affect: Mood normal.        Behavior: Behavior normal.        Judgment: Judgment normal.     Female chaperone present for pelvic and breast  portions of the physical exam  Assessment: 20 y.o. G1P0010 female  here for  1. Secondary oligomenorrhea   2. Hyperprolactinemia (HCC)   3. Screen for STD (sexually transmitted disease)   4. Pap smear for cervical cancer screening      Plan: Problem List Items Addressed This Visit      Endocrine   Hyperprolactinemia (HCC)   Relevant Orders   Prolactin   TSH    Other Visit Diagnoses    Secondary oligomenorrhea    -  Primary   Relevant Orders   Prolactin   Screen for STD (sexually transmitted disease)       Relevant Orders   Cervicovaginal ancillary only   Cytology - PAP   Pap smear for cervical cancer screening       Relevant Orders   Cytology - PAP     Will check prolactin and thyroid levels.  If normal, may consider PCOS workup.  If abnormal, will refer back to endocrinologist. Though, endocrinologist appt may be warranted either way given her history.  Pap smear and aptima today.   A total of 21 minutes were spent face-to-face with the patient as well as preparation, review, communication, and documentation during this encounter.    Thomasene Mohair, MD 12/27/2020 9:38 AM

## 2020-12-28 LAB — TSH: TSH: 1.73 u[IU]/mL (ref 0.450–4.500)

## 2020-12-28 LAB — PROLACTIN: Prolactin: 32.5 ng/mL — ABNORMAL HIGH (ref 4.8–23.3)

## 2020-12-29 LAB — CERVICOVAGINAL ANCILLARY ONLY
Chlamydia: NEGATIVE
Comment: NEGATIVE
Comment: NEGATIVE
Comment: NORMAL
Neisseria Gonorrhea: NEGATIVE
Trichomonas: NEGATIVE

## 2020-12-29 LAB — CYTOLOGY - PAP: Diagnosis: NEGATIVE

## 2021-01-04 ENCOUNTER — Ambulatory Visit (INDEPENDENT_AMBULATORY_CARE_PROVIDER_SITE_OTHER): Payer: BC Managed Care – PPO | Admitting: Endocrinology

## 2021-01-04 ENCOUNTER — Other Ambulatory Visit: Payer: Self-pay

## 2021-01-04 VITALS — BP 130/90 | HR 85 | Ht 62.0 in | Wt 173.8 lb

## 2021-01-04 DIAGNOSIS — E221 Hyperprolactinemia: Secondary | ICD-10-CM

## 2021-01-04 MED ORDER — CABERGOLINE 0.5 MG PO TABS: 0.2500 mg | ORAL_TABLET | ORAL | 5 refills | Status: DC

## 2021-01-04 NOTE — Patient Instructions (Addendum)
I have sent a prescription to your pharmacy, to resume the cabergoline.   Please come back for a follow-up appointment in 3 months.

## 2021-01-04 NOTE — Progress Notes (Signed)
Subjective:    Patient ID: Mckenzie Brown, female    DOB: 2001-05-23, 20 y.o.   MRN: [id number]  HPI Pt returns for f/u of hyperprolactinemia (dx'ed 2018; no cause was found; she is G1P0 (miscarriage); she does not want a pregnancy now, but would like to preserve fertility for the future.  She took oral contraceptives 2013-2018.  She did not tolerate Mirena device (headache). Menses stopped in mid-2020; parlodel caused worsening of acne).  Pt says she is at risk for pregnancy. She again reports irreg menses.   She has not recently taken cabergoline.  She also reports weight gain.   Past Medical History:  Diagnosis Date  . 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 Major depressive disorder   . Migraines   . Suicide attempt by drug ingestion [facility])     Past Surgical History:  Procedure Laterality Date  . DILATION AND EVACUATION N/A 09/03/2015   Procedure: DILATATION AND EVACUATION;  Surgeon: Vena Austria, MD;  Location: [facility] OR[facility];  Service: Gynecology;  Laterality: N/A;  . dnc    . TONSILLECTOMY N/A 04/25/2017   Procedure: TONSILLECTOMY;  Surgeon: Bud Face, MD;  Location: [facility];  Service: ENT;  Laterality: N/A;  . TONSILLECTOMY    . WISDOM TOOTH EXTRACTION      Social History   Socioeconomic History  . Marital status: Single    Spouse name: Not on file  . Number of children: Not on file  . Years of education: Not on file  . Highest education level: Not on file  Occupational History  . Not on file  Tobacco Use  . Smoking status: Passive Smoke Exposure - Never Smoker  . Smokeless tobacco: Never Used  Vaping Use  . Vaping Use: Never used  Substance and Sexual Activity  . Alcohol use: No  . Drug use: No  . Sexual activity: Yes    Birth control/protection: Condom  Other Topics Concern  . Not on file  Social History Narrative   ** Merged History Encounter **       Lives with Mother, [name].  No siblings.  1 cat.  Stepfather smokes outside.    Social Determinants of Health   Financial Resource Strain: Not on file  Food Insecurity: Not on file  Transportation Needs: Not on file  Physical Activity: Not on file  Stress: Not on file  Social Connections: Not on file  Intimate Partner Violence: Not on file    Current Outpatient Medications on File Prior to Visit  Medication Sig Dispense Refill  . clobetasol cream (TEMOVATE) 0.05 % Apply 1 application topically. Apply topically to affected areas once per week prn    . Multiple Vitamins-Minerals (HAIR VITAMINS PO) Take 1 capsule by mouth daily.     No current facility-administered medications on file prior to visit.    No Known Allergies  Family History  Problem Relation Age of Onset  . Hypertension Mother   . Anxiety disorder Mother   . Depression Mother   . Diabetes Maternal Grandmother   . Hypertension Maternal Grandmother   . Diabetes Maternal Grandfather   . Parkinson's disease Maternal Grandfather   . Other Neg Hx        pituitary disorder    BP 130/90 (BP Location: Right Arm, Patient Position: Sitting, Cuff Size: Normal)   Pulse 85   Ht 5\' 2"  (1.575 m)   Wt 173 lb 12.8 oz (78.8 kg)   SpO2 99%   BMI 31.79 kg/m  Review of Systems Denies galactorrhea.    Objective:   Physical Exam VITAL SIGNS:  See vs page GENERAL: no distress NECK: thyroid is slightly and diffusely enlarged.   Lab Results  Component Value Date   TSH 1.730 12/27/2020   Prolactin=33     Assessment & Plan:  Hyperprolactinemia: uncontrolled.    Patient Instructions  I have sent a prescription to your pharmacy, to resume the cabergoline.   Please come back for a follow-up appointment in 3 months.

## 2021-02-01 DIAGNOSIS — R110 Nausea: Secondary | ICD-10-CM | POA: Diagnosis not present

## 2021-02-01 DIAGNOSIS — J069 Acute upper respiratory infection, unspecified: Secondary | ICD-10-CM | POA: Diagnosis not present

## 2021-03-25 ENCOUNTER — Telehealth: Payer: Self-pay | Admitting: Endocrinology

## 2021-03-25 DIAGNOSIS — E221 Hyperprolactinemia: Secondary | ICD-10-CM

## 2021-03-25 NOTE — Telephone Encounter (Signed)
Pt calling to resch her app and states that blood work needs to be done before her visit 05/31/2021. If so can we sch her for it

## 2021-03-29 NOTE — Telephone Encounter (Signed)
done

## 2021-04-06 ENCOUNTER — Ambulatory Visit: Payer: BC Managed Care – PPO | Admitting: Endocrinology

## 2021-04-22 ENCOUNTER — Telehealth: Payer: Self-pay | Admitting: Endocrinology

## 2021-04-22 NOTE — Telephone Encounter (Signed)
Please advise 

## 2021-04-22 NOTE — Telephone Encounter (Signed)
Pt is stating that insurance is not going to cover cabergoline (DOSTINEX) 0.5 MG tablet  Unless it is sent through [facility]. Pt would like a call back on regarding on what to do

## 2021-04-23 ENCOUNTER — Other Ambulatory Visit: Payer: Self-pay | Admitting: Endocrinology

## 2021-04-23 MED ORDER — CABERGOLINE 0.5 MG PO TABS: 0.2500 mg | ORAL_TABLET | ORAL | 3 refills | Status: DC

## 2021-05-17 DIAGNOSIS — Z6829 Body mass index (BMI) 29.0-29.9, adult: Secondary | ICD-10-CM | POA: Diagnosis not present

## 2021-05-17 DIAGNOSIS — L309 Dermatitis, unspecified: Secondary | ICD-10-CM | POA: Diagnosis not present

## 2021-05-31 ENCOUNTER — Ambulatory Visit (INDEPENDENT_AMBULATORY_CARE_PROVIDER_SITE_OTHER): Payer: BC Managed Care – PPO | Admitting: Endocrinology

## 2021-05-31 ENCOUNTER — Other Ambulatory Visit: Payer: Self-pay

## 2021-05-31 VITALS — BP 108/80 | HR 83 | Ht 62.0 in | Wt 162.8 lb

## 2021-05-31 DIAGNOSIS — E221 Hyperprolactinemia: Secondary | ICD-10-CM

## 2021-05-31 NOTE — Progress Notes (Signed)
Subjective:    Patient ID: Mckenzie Brown, female    DOB: [birth year], 20 y.o.   MRN: [id number]  HPI Pt returns for f/u of hyperprolactinemia (dx'ed 2018; no cause was found; MRI (2019) made no mention of the pituitary; she is G1P0 (miscarriage); she does not want a pregnancy now, but would like to preserve fertility for the future.  She took oral contraceptives 2013-2018; she did not tolerate Mirena device (headache).  parlodel caused worsening of acne, so she was changed to dostinex).   Back on cabergoline, pt states she feels well in general.  She has little if any headache. She has irreg menses.  Pt says she is at risk for pregnancy.   Past Medical History:  Diagnosis Date   Anxiety disorder of adolescence 04/12/2016   Major depressive disorder    Migraines    Suicide attempt by drug ingestion [facility])     Past Surgical History:  Procedure Laterality Date   DILATION AND EVACUATION N/A 09/03/2015   Procedure: DILATATION AND EVACUATION;  Surgeon: Vena Austria, MD;  Location: [facility] OR[facility];  Service: Gynecology;  Laterality: N/A;   dnc     TONSILLECTOMY N/A 04/25/2017   Procedure: TONSILLECTOMY;  Surgeon: Bud Face, MD;  Location: [facility];  Service: ENT;  Laterality: N/A;   TONSILLECTOMY     WISDOM TOOTH EXTRACTION      Social History   Socioeconomic History   Marital status: Single    Spouse name: Not on file   Number of children: Not on file   Years of education: Not on file   Highest education level: Not on file  Occupational History   Not on file  Tobacco Use   Smoking status: Passive Smoke Exposure - Never Smoker   Smokeless tobacco: Never  Vaping Use   Vaping Use: Never used  Substance and Sexual Activity   Alcohol use: No   Drug use: No   Sexual activity: Yes    Birth control/protection: Condom  Other Topics Concern   Not on file  Social History Narrative   ** Merged History Encounter **       Lives with Mother, Mckenzie Brown.  No siblings.  1  cat.  Stepfather smokes outside.   Social Determinants of Health   Financial Resource Strain: Not on file  Food Insecurity: Not on file  Transportation Needs: Not on file  Physical Activity: Not on file  Stress: Not on file  Social Connections: Not on file  Intimate Partner Violence: Not on file    Current Outpatient Medications on File Prior to Visit  Medication Sig Dispense Refill   cabergoline (DOSTINEX) 0.5 MG tablet Take 0.5 tablets (0.25 mg total) by mouth 2 (two) times a week. 30 tablet 3   Multiple Vitamins-Minerals (HAIR VITAMINS PO) Take 1 capsule by mouth daily.     clobetasol cream (TEMOVATE) 0.05 % Apply 1 application topically. Apply topically to affected areas once per week prn (Patient not taking: Reported on 05/31/2021)     No current facility-administered medications on file prior to visit.    No Known Allergies  Family History  Problem Relation Age of Onset   Hypertension Mother    Anxiety disorder Mother    Depression Mother    Diabetes Maternal Grandmother    Hypertension Maternal Grandmother    Diabetes Maternal Grandfather    Parkinson's disease Maternal Grandfather    Other Neg Hx        pituitary disorder    BP  108/80 (BP Location: Right Arm, Patient Position: Sitting, Cuff Size: Normal)   Pulse 83   Ht 5\' 2"  (1.575 m)   Wt 162 lb 12.8 oz (73.8 kg)   SpO2 99%   BMI 29.78 kg/m    Review of Systems Denies n/v/galactorrhea    Objective:   Physical Exam VITAL SIGNS:  See vs page.   GENERAL: no distress.   NECK: There is no palpable thyroid enlargement.  No thyroid nodule is palpable.  No palpable lymphadenopathy at the anterior neck.  Prolactin=normal    Assessment & Plan:  Hyperprolactinemia: well-controlled.  Please continue the same dostinex

## 2021-05-31 NOTE — Patient Instructions (Addendum)
Blood tests are requested for you today.  We'll let you know about the results.  Please come back for a follow-up appointment in 6 months.   Please call sooner if the pregnancy happens.

## 2021-06-01 ENCOUNTER — Encounter: Payer: Self-pay | Admitting: Endocrinology

## 2021-06-01 LAB — PROLACTIN: Prolactin: 5.9 ng/mL

## 2021-06-15 DIAGNOSIS — H16141 Punctate keratitis, right eye: Secondary | ICD-10-CM | POA: Diagnosis not present

## 2021-06-28 ENCOUNTER — Telehealth: Payer: Self-pay

## 2021-06-28 NOTE — Telephone Encounter (Signed)
Patient is calling with complaint just started her menstrual cycle, and is having large clots with pelvic pain that radiates to the back of her spine. Patient is wanting to know is this is normal please advise. Patient denies need for appointment at this time.

## 2021-06-28 NOTE — Telephone Encounter (Signed)
Pt states she is not on bc; admits to saturating a pad every to 1 hr.  Adv it's our policy that she go to the ED.

## 2021-07-27 DIAGNOSIS — L709 Acne, unspecified: Secondary | ICD-10-CM | POA: Diagnosis not present

## 2021-07-27 DIAGNOSIS — L309 Dermatitis, unspecified: Secondary | ICD-10-CM | POA: Diagnosis not present

## 2021-07-27 DIAGNOSIS — Z6829 Body mass index (BMI) 29.0-29.9, adult: Secondary | ICD-10-CM | POA: Diagnosis not present

## 2021-08-23 ENCOUNTER — Other Ambulatory Visit: Payer: Self-pay

## 2021-08-23 ENCOUNTER — Ambulatory Visit (INDEPENDENT_AMBULATORY_CARE_PROVIDER_SITE_OTHER): Payer: BC Managed Care – PPO | Admitting: Endocrinology

## 2021-08-23 VITALS — BP 110/70 | HR 76 | Ht 62.0 in | Wt 168.2 lb

## 2021-08-23 DIAGNOSIS — E221 Hyperprolactinemia: Secondary | ICD-10-CM

## 2021-08-23 DIAGNOSIS — E049 Nontoxic goiter, unspecified: Secondary | ICD-10-CM

## 2021-08-23 LAB — T4, FREE: Free T4: 1.62 ng/dL — ABNORMAL HIGH (ref 0.60–1.60)

## 2021-08-23 LAB — TSH: TSH: 1.54 u[IU]/mL (ref 0.35–5.50)

## 2021-08-23 NOTE — Patient Instructions (Addendum)
Blood tests are requested for you today.  We'll let you know about the results.   Please come back for a follow-up appointment in 6 months.   Let's check the ultrasound.  you will receive a phone call, about a day and time for an appointment.  Please call sooner if the pregnancy happens.

## 2021-08-23 NOTE — Progress Notes (Signed)
Subjective:    Patient ID: Mckenzie Brown, female    DOB: 04-Nov-2001, 20 y.o.   MRN: [id number]  HPI Pt returns for f/u of hyperprolactinemia (dx'ed 2018; no cause was found; MRI (2019) made no mention of the pituitary; she is G1P0 (miscarriage); she does not want a pregnancy now, but would like to preserve fertility for the future.  She took oral contraceptives 2013-2018; she did not tolerate Mirena device (headache).  parlodel caused worsening of acne, so she was changed to dostinex).   She has menses approx every 2 mos.  Pt says she is at risk for pregnancy.  Nausea persists (home preg test was neg).    Past Medical History:  Diagnosis Date   Anxiety disorder of adolescence 04/12/2016   Major depressive disorder    Migraines    Suicide attempt by drug ingestion [facility])     Past Surgical History:  Procedure Laterality Date   DILATION AND EVACUATION N/A 09/03/2015   Procedure: DILATATION AND EVACUATION;  Surgeon: Vena Austria, MD;  Location: [facility] OR[facility];  Service: Gynecology;  Laterality: N/A;   dnc     TONSILLECTOMY N/A 04/25/2017   Procedure: TONSILLECTOMY;  Surgeon: Bud Face, MD;  Location: [facility];  Service: ENT;  Laterality: N/A;   TONSILLECTOMY     WISDOM TOOTH EXTRACTION      Social History   Socioeconomic History   Marital status: Single    Spouse name: Not on file   Number of children: Not on file   Years of education: Not on file   Highest education level: Not on file  Occupational History   Not on file  Tobacco Use   Smoking status: Passive Smoke Exposure - Never Smoker   Smokeless tobacco: Never  Vaping Use   Vaping Use: Never used  Substance and Sexual Activity   Alcohol use: No   Drug use: No   Sexual activity: Yes    Birth control/protection: Condom  Other Topics Concern   Not on file  Social History Narrative   ** Merged History Encounter **       Lives with Mother, [name].  No siblings.  1 cat.  Stepfather smokes outside.    Social Determinants of Health   Financial Resource Strain: Not on file  Food Insecurity: Not on file  Transportation Needs: Not on file  Physical Activity: Not on file  Stress: Not on file  Social Connections: Not on file  Intimate Partner Violence: Not on file    Current Outpatient Medications on File Prior to Visit  Medication Sig Dispense Refill   cabergoline (DOSTINEX) 0.5 MG tablet Take 0.5 tablets (0.25 mg total) by mouth 2 (two) times a week. 30 tablet 3   clobetasol cream (TEMOVATE) 0.05 % Apply 1 application topically. Apply topically to affected areas once per week prn     Multiple Vitamins-Minerals (HAIR VITAMINS PO) Take 1 capsule by mouth daily.     No current facility-administered medications on file prior to visit.    No Known Allergies  Family History  Problem Relation Age of Onset   Hypertension Mother    Anxiety disorder Mother    Depression Mother    Diabetes Maternal Grandmother    Hypertension Maternal Grandmother    Diabetes Maternal Grandfather    Parkinson's disease Maternal Grandfather    Other Neg Hx        pituitary disorder    BP 110/70 (BP Location: Right Arm, Patient Position: Sitting, Cuff Size: Normal)  Pulse 76   Ht 5\' 2"  (1.575 m)   Wt 168 lb 3.2 oz (76.3 kg)   SpO2 98%   BMI 30.76 kg/m    Review of Systems No galactorrhea.  She reports weight gain.    Objective:   Physical Exam VITAL SIGNS:  See vs page GENERAL: no distress NECK: thyroid is slightly and diffusely enlarged  Prolactin=5    Assessment & Plan:  Hyperprolactinemia: well-controlled.  Please continue the same cabergoline Weight gain: check TFT.

## 2021-08-24 LAB — PROLACTIN: Prolactin: 5 ng/mL

## 2021-08-25 ENCOUNTER — Encounter: Payer: Self-pay | Admitting: Endocrinology

## 2021-09-14 ENCOUNTER — Encounter: Payer: Self-pay | Admitting: Endocrinology

## 2021-09-14 ENCOUNTER — Ambulatory Visit
Admission: RE | Admit: 2021-09-14 | Discharge: 2021-09-14 | Disposition: A | Discharge status: Home / Self Care | Payer: BC Managed Care – PPO | Source: Ambulatory Visit | Attending: Endocrinology | Admitting: Endocrinology

## 2021-09-14 ENCOUNTER — Other Ambulatory Visit: Payer: Self-pay

## 2021-09-14 DIAGNOSIS — E049 Nontoxic goiter, unspecified: Secondary | ICD-10-CM | POA: Diagnosis not present

## 2021-09-14 DIAGNOSIS — E041 Nontoxic single thyroid nodule: Secondary | ICD-10-CM | POA: Diagnosis not present

## 2021-10-07 NOTE — Telephone Encounter (Signed)
Not rcvd. U/S ordered for pelvic pain.

## 2021-10-31 ENCOUNTER — Ambulatory Visit (HOSPITAL_COMMUNITY)
Admission: EM | Admit: 2021-10-31 | Discharge: 2021-10-31 | Disposition: A | Discharge status: Home / Self Care | Payer: BC Managed Care – PPO | Attending: Family Medicine | Admitting: Family Medicine

## 2021-10-31 ENCOUNTER — Encounter (HOSPITAL_COMMUNITY): Payer: Self-pay

## 2021-10-31 ENCOUNTER — Other Ambulatory Visit: Payer: Self-pay

## 2021-10-31 DIAGNOSIS — J069 Acute upper respiratory infection, unspecified: Secondary | ICD-10-CM | POA: Insufficient documentation

## 2021-10-31 DIAGNOSIS — R21 Rash and other nonspecific skin eruption: Secondary | ICD-10-CM | POA: Diagnosis not present

## 2021-10-31 DIAGNOSIS — Z20822 Contact with and (suspected) exposure to covid-19: Secondary | ICD-10-CM | POA: Insufficient documentation

## 2021-10-31 LAB — POCT RAPID STREP A, ED / UC: Streptococcus, Group A Screen (Direct): NEGATIVE

## 2021-10-31 LAB — POC INFLUENZA A AND B ANTIGEN (URGENT CARE ONLY)
INFLUENZA A ANTIGEN, POC: NEGATIVE
INFLUENZA B ANTIGEN, POC: NEGATIVE

## 2021-10-31 MED ORDER — METHYLPREDNISOLONE 4 MG PO TBPK: ORAL_TABLET | ORAL | 0 refills | Status: DC

## 2021-10-31 NOTE — ED Provider Notes (Signed)
[facility]    CSN: [id number] Arrival date & time: 10/31/21  1815      History   Chief Complaint Chief Complaint  Patient presents with   Rash   Cough    HPI Mckenzie Brown is a 20 y.o. female.    Rash Cough Associated symptoms: rash   Here for h/o rash since 12/24, mainly on arms, and is pruritis. No dyspnea or lip/throat swelling.  Then 12/25 began having sore throat and nasal congestion. Temp here 99.1. No chills, n/v/d.   Past Medical History:  Diagnosis Date   Anxiety disorder of adolescence 04/12/2016   Major depressive disorder    Migraines    Suicide attempt by drug ingestion [facility])     Patient Active Problem List   Diagnosis Date Noted   Goiter 08/23/2021   Hyperprolactinemia (HCC) 08/22/2019   MDD (major depressive disorder), recurrent episode, severe (HCC) 11/15/2016   Overdose by acetaminophen 11/14/2016   Insomnia 04/13/2016   Anxiety disorder of adolescence 04/12/2016   Suicidal overdose (HCC) 04/12/2016   MDD (major depressive disorder), recurrent episode, mild (HCC) 04/11/2016    Past Surgical History:  Procedure Laterality Date   DILATION AND EVACUATION N/A 09/03/2015   Procedure: DILATATION AND EVACUATION;  Surgeon: Vena Austria, MD;  Location: [facility];  Service: Gynecology;  Laterality: N/A;   dnc     TONSILLECTOMY N/A 04/25/2017   Procedure: TONSILLECTOMY;  Surgeon: Bud Face, MD;  Location: [facility];  Service: ENT;  Laterality: N/A;   TONSILLECTOMY     WISDOM TOOTH EXTRACTION      OB History     Gravida  1   Para  0   Term  0   Preterm  0   AB  1   Living         SAB  1   IAB  0   Ectopic  0   Multiple      Live Births               Home Medications    Prior to Admission medications   Medication Sig Start Date End Date Taking? Authorizing Provider  methylPREDNISolone (MEDROL DOSEPAK) 4 MG TBPK tablet Take as per package instructions 10/31/21  Yes Zenia Resides,  MD  cabergoline (DOSTINEX) 0.5 MG tablet Take 0.5 tablets (0.25 mg total) by mouth 2 (two) times a week. 04/25/21   Romero Belling, MD  clobetasol cream (TEMOVATE) 0.05 % Apply 1 application topically. Apply topically to affected areas once per week prn    [provider]  doxycycline (VIBRA-TABS) 100 MG tablet Take 100 mg by mouth 2 (two) times daily. 08/30/21   [provider]  Multiple Vitamins-Minerals (HAIR VITAMINS PO) Take 1 capsule by mouth daily.    [provider]    Family History Family History  Problem Relation Age of Onset   Hypertension Mother    Anxiety disorder Mother    Depression Mother    Diabetes Maternal Grandmother    Hypertension Maternal Grandmother    Diabetes Maternal Grandfather    Parkinson's disease Maternal Grandfather    Other Neg Hx        pituitary disorder    Social History Social History   Tobacco Use   Smoking status: Passive Smoke Exposure - Never Smoker   Smokeless tobacco: Never  Vaping Use   Vaping Use: Never used  Substance Use Topics   Alcohol use: No   Drug use: No  Allergies   Patient has no known allergies.   Review of Systems Review of Systems  Respiratory:  Positive for cough.   Skin:  Positive for rash.    Physical Exam Triage Vital Signs ED Triage Vitals  Enc Vitals Group     BP 10/31/21 1850 (!) 129/91     Pulse Rate 10/31/21 1850 85     Resp 10/31/21 1850 18     Temp 10/31/21 1850 99.1 F (37.3 C)     Temp Source 10/31/21 1850 Oral     SpO2 10/31/21 1850 99 %     Weight --      Height --      Head Circumference --      Peak Flow --      Pain Score 10/31/21 1852 0     Pain Loc --      Pain Edu? --      Excl. in GC? --    No data found.  Updated Vital Signs BP (!) 129/91 (BP Location: Left Arm)    Pulse 85    Temp 99.1 F (37.3 C) (Oral)    Resp 18    SpO2 99%   Visual Acuity Right Eye Distance:   Left Eye Distance:   Bilateral Distance:    Right Eye Near:   Left  Eye Near:    Bilateral Near:     Physical Exam Vitals reviewed.  Constitutional:      General: She is not in acute distress.    Appearance: She is not toxic-appearing.  HENT:     Right Ear: Tympanic membrane normal.     Left Ear: Tympanic membrane normal.     Nose: Nose normal.     Mouth/Throat:     Mouth: Mucous membranes are moist.     Pharynx: Oropharyngeal exudate (clear mucus) and posterior oropharyngeal erythema (has erythema of the tonsillar pillars and of the posterior OP) present.  Eyes:     Extraocular Movements: Extraocular movements intact.     Conjunctiva/sclera: Conjunctivae normal.     Pupils: Pupils are equal, round, and reactive to light.  Cardiovascular:     Rate and Rhythm: Normal rate and regular rhythm.     Heart sounds: No murmur heard. Pulmonary:     Breath sounds: No wheezing, rhonchi or rales.  Musculoskeletal:     Cervical back: Neck supple.  Lymphadenopathy:     Cervical: No cervical adenopathy.  Skin:    Capillary Refill: Capillary refill takes less than 2 seconds.     Coloration: Skin is not jaundiced or pale.  Neurological:     General: No focal deficit present.     Mental Status: She is alert and oriented to person, place, and time.  Psychiatric:        Behavior: Behavior normal.     UC Treatments / Results  Labs (all labs ordered are listed, but only abnormal results are displayed) Labs Reviewed  SARS CORONAVIRUS 2 (TAT 6-24 HRS)  CULTURE, GROUP A STREP ([facility])  POC INFLUENZA A AND B ANTIGEN (URGENT CARE ONLY)  POCT RAPID STREP A, ED / UC    EKG   Radiology No results found.  Procedures Procedures (including critical care time)  Medications Ordered in UC Medications - No data to display  Initial Impression / Assessment and Plan / UC Course  I have reviewed the triage vital signs and the nursing notes.  Pertinent labs & imaging results that were available during my care of the  patient were reviewed by me and considered in  my medical decision making (see chart for details).     Flu test neg. Strep test negative. Will send c/s and do covid swab today. Medrol pack for the dermatitis/reaction. Final Clinical Impressions(s) / UC Diagnoses   Final diagnoses:  Viral upper respiratory tract infection  Rash and nonspecific skin eruption     Discharge Instructions      Your flu test was negative.  Your strep test is negative.   You have been swabbed for COVID, and the test will result in the next 24 hours. Our staff will call you if positive. If the test is positive, you should quarantine for 5 days.   Take the medrol pack as directed for the rash/dermatitis/reaction     ED Prescriptions     Medication Sig Dispense Auth. Provider   methylPREDNISolone (MEDROL DOSEPAK) 4 MG TBPK tablet Take as per package instructions 1 each Marlinda Mike, Janace Aris, MD      PDMP not reviewed this encounter.   Zenia Resides, MD 10/31/21 [phone]

## 2021-10-31 NOTE — Discharge Instructions (Addendum)
Your flu test was negative.  Your strep test is negative.   You have been swabbed for COVID, and the test will result in the next 24 hours. Our staff will call you if positive. If the test is positive, you should quarantine for 5 days.   Take the medrol pack as directed for the rash/dermatitis/reaction

## 2021-10-31 NOTE — ED Triage Notes (Signed)
Pt c/o rash to bilateral arms x2 days, cough and congestion today.

## 2021-11-01 LAB — SARS CORONAVIRUS 2 (TAT 6-24 HRS): SARS Coronavirus 2: NEGATIVE

## 2021-11-03 LAB — CULTURE, GROUP A STREP (THRC)

## 2021-11-23 DIAGNOSIS — Z6829 Body mass index (BMI) 29.0-29.9, adult: Secondary | ICD-10-CM | POA: Diagnosis not present

## 2021-11-23 DIAGNOSIS — R059 Cough, unspecified: Secondary | ICD-10-CM | POA: Diagnosis not present

## 2021-11-23 DIAGNOSIS — H6692 Otitis media, unspecified, left ear: Secondary | ICD-10-CM | POA: Diagnosis not present

## 2021-12-01 ENCOUNTER — Ambulatory Visit: Payer: BC Managed Care – PPO | Admitting: Endocrinology

## 2021-12-13 DIAGNOSIS — Z6829 Body mass index (BMI) 29.0-29.9, adult: Secondary | ICD-10-CM | POA: Diagnosis not present

## 2021-12-13 DIAGNOSIS — F418 Other specified anxiety disorders: Secondary | ICD-10-CM | POA: Diagnosis not present

## 2022-01-24 DIAGNOSIS — G4700 Insomnia, unspecified: Secondary | ICD-10-CM | POA: Diagnosis not present

## 2022-01-24 DIAGNOSIS — Z6829 Body mass index (BMI) 29.0-29.9, adult: Secondary | ICD-10-CM | POA: Diagnosis not present

## 2022-01-24 DIAGNOSIS — F418 Other specified anxiety disorders: Secondary | ICD-10-CM | POA: Diagnosis not present

## 2022-02-22 ENCOUNTER — Ambulatory Visit: Payer: BC Managed Care – PPO | Admitting: Endocrinology

## 2022-07-14 ENCOUNTER — Encounter: Payer: Self-pay | Admitting: Internal Medicine

## 2022-07-14 ENCOUNTER — Ambulatory Visit (INDEPENDENT_AMBULATORY_CARE_PROVIDER_SITE_OTHER): Payer: BC Managed Care – PPO | Admitting: Internal Medicine

## 2022-07-14 VITALS — BP 122/78 | HR 84 | Ht 62.0 in | Wt 148.0 lb

## 2022-07-14 DIAGNOSIS — R7989 Other specified abnormal findings of blood chemistry: Secondary | ICD-10-CM

## 2022-07-14 DIAGNOSIS — E221 Hyperprolactinemia: Secondary | ICD-10-CM

## 2022-07-14 DIAGNOSIS — E010 Iodine-deficiency related diffuse (endemic) goiter: Secondary | ICD-10-CM

## 2022-07-14 LAB — T4, FREE: Free T4: 1.82 ng/dL — ABNORMAL HIGH (ref 0.60–1.60)

## 2022-07-14 LAB — BASIC METABOLIC PANEL
BUN: 20 mg/dL (ref 6–23)
CO2: 28 mEq/L (ref 19–32)
Calcium: 9.3 mg/dL (ref 8.4–10.5)
Chloride: 105 mEq/L (ref 96–112)
Creatinine, Ser: 0.75 mg/dL (ref 0.40–1.20)
GFR: 112.91 mL/min (ref >60.00)
Glucose, Bld: 91 mg/dL (ref 70–99)
Potassium: 4.6 mEq/L (ref 3.5–5.1)
Sodium: 138 mEq/L (ref 135–145)

## 2022-07-14 LAB — TSH: TSH: 1.64 u[IU]/mL (ref 0.35–5.50)

## 2022-07-14 NOTE — Progress Notes (Unsigned)
Name: Mckenzie Brown  MRN/ DOB: [id number], 26-Oct-2001    Age/ Sex: 21 y.o., female     PCP: Arlyss Queen   Reason for Endocrinology Evaluation: Hyperprolactinemia     Initial Endocrinology Clinic Visit: 09/11/2019    PATIENT IDENTIFIER: Mckenzie Brown is a 21 y.o., female with a past medical history of hyperprolactinemia . She has followed with [facility] since 09/11/2019 for consultative assistance with management of her hyperprolactinemia.   HISTORICAL SUMMARY: The patient was first diagnosed with hyperprolactinemia in 2018, with a max level of 51.2 NG/mL in January 2018.  MRI negative for pituitary pathology. She was on OCPs from 2013-2018 She is G1P0  Mirena caused headaches Bromocriptine caused acne Cabergoline was stopped 05/2022  SUBJECTIVE:    Today (07/14/2022):  [name] is here for hyperprolactinemia.   She is planning on conceiving in the next 3 years  No anatacid use  Denies cannabis  No opiate use No nipple discharge  LMP 8/7th, 2023 has been regular with weight loss  Lost 26 lbs -intentional  Denies headaches but has noted change in vision , last eye exam >2 yrs  Denies nausea  She is sexually active and uses condoms   NO local neck swelling   She has been without cabergoline as she ran out of the prescription for the past 6 weeks    HISTORY:  Past Medical History:  Past Medical History:  Diagnosis Date   Anxiety disorder of adolescence 04/12/2016   Major depressive disorder    Migraines    Suicide attempt by drug ingestion [facility])    Past Surgical History:  Past Surgical History:  Procedure Laterality Date   DILATION AND EVACUATION N/A 09/03/2015   Procedure: DILATATION AND EVACUATION;  Surgeon: Vena Austria, MD;  Location: [facility];  Service: Gynecology;  Laterality: N/A;   dnc     TONSILLECTOMY N/A 04/25/2017   Procedure: TONSILLECTOMY;  Surgeon: Bud Face, MD;  Location: [facility];   Service: ENT;  Laterality: N/A;   TONSILLECTOMY     WISDOM TOOTH EXTRACTION     Social History:  reports that she has never smoked. She has been exposed to tobacco smoke. She has never used smokeless tobacco. She reports that she does not drink alcohol and does not use drugs. Family History:  Family History  Problem Relation Age of Onset   Hypertension Mother    Anxiety disorder Mother    Depression Mother    Diabetes Maternal Grandmother    Hypertension Maternal Grandmother    Diabetes Maternal Grandfather    Parkinson's disease Maternal Grandfather    Other Neg Hx        pituitary disorder     HOME MEDICATIONS: Allergies as of 07/14/2022   No Known Allergies      Medication List        Accurate as of July 14, 2022  8:40 AM. If you have any questions, ask your nurse or doctor.          STOP taking these medications    clobetasol cream 0.05 % Commonly known as: TEMOVATE Stopped by: Scarlette Shorts, MD   doxycycline 100 MG tablet Commonly known as: VIBRA-TABS Stopped by: Scarlette Shorts, MD   HAIR VITAMINS PO Stopped by: Scarlette Shorts, MD   methylPREDNISolone 4 MG Tbpk tablet Commonly known as: MEDROL DOSEPAK Stopped by: Scarlette Shorts, MD       TAKE these medications    buPROPion  150 MG 24 hr tablet Commonly known as: WELLBUTRIN XL Take 150 mg by mouth every morning.   cabergoline 0.5 MG tablet Commonly known as: DOSTINEX Take by mouth. What changed: Another medication with the same name was removed. Continue taking this medication, and follow the directions you see here. Changed by: Scarlette Shorts, MD          OBJECTIVE:   PHYSICAL EXAM: VS: BP 122/78 (BP Location: Left Arm, Patient Position: Sitting, Cuff Size: Small)   Pulse 84   Ht 5\' 2"  (1.575 m)   Wt 148 lb (67.1 kg)   SpO2 98%   BMI 27.07 kg/m    EXAM: General: Pt appears well and is in NAD  Neck: General: Supple without adenopathy. Thyroid:  Thyroid size normal.  No goiter or nodules appreciated.   Lungs: Clear with good BS bilat with no rales, rhonchi, or wheezes  Heart: Auscultation: RRR.  Abdomen: Normoactive bowel sounds, soft, nontender, without masses or organomegaly palpable  Extremities:  BL LE: No pretibial edema normal ROM and strength.  Mental Status: Judgment, insight: Intact Orientation: Oriented to time, place, and person Mood and affect: No depression, anxiety, or agitation     DATA REVIEWED:   Latest Reference Range & Units 07/14/22 09:01  Sodium 135 - 145 mEq/L 138  Potassium 3.5 - 5.1 mEq/L 4.6  Chloride 96 - 112 mEq/L 105  CO2 19 - 32 mEq/L 28  Glucose 70 - 99 mg/dL 91  BUN 6 - 23 mg/dL 20  Creatinine 09/13/22 - 9.50 mg/dL 9.32  Calcium 8.4 - 6.71 mg/dL 9.3  GFR 24.5 mL/min 112.91    Latest Reference Range & Units 07/14/22 09:01  Prolactin ng/mL 25.9  Glucose 70 - 99 mg/dL 91  TSH 09/13/22 - 8.33 uIU/mL 1.64  T4,Free(Direct) 0.60 - 1.60 ng/dL 8.25 (H)     ASSESSMENT / PLAN / RECOMMENDATIONS:   Hyperprolactinemia:   - No clinical evidence of hyperprolactinemia with lack of galactorrhea and regulation of menstrual cycle  - She has been without cabergoline for ~ 6 weeks  - MRI negative for pituitary adenoma  - Repeat prolactin is normal, will hold off on starting Cabergoline and repeat in 2 months     2. Thyromegaly :  - Reviewed thyroid ultrasound , right sub-centimeter nodule noted - No indications for serial monitoring  - No local neck symptoms  - Reassurance provided  - Pt has been noted with elevated FT4 but normal TSH. Suspect essay interference , she will be asked to check on Biotin use  - Will repeat in 2 months and include T3   Repeat prolactin /TFT's in 2 months  F/U in 6 months   Signed electronically by: 0.53, MD  [facility]  [facility] [address] [place] [place], [place] Phone: [phone] FAX: [phone]       CC: [phone] [address] [facility] [place] Phone: [phone]  Fax: [phone]   Return to Endocrinology clinic as below: No future appointments.

## 2022-07-15 LAB — PROLACTIN: Prolactin: 25.9 ng/mL

## 2022-07-16 DIAGNOSIS — E010 Iodine-deficiency related diffuse (endemic) goiter: Secondary | ICD-10-CM | POA: Insufficient documentation

## 2022-07-26 ENCOUNTER — Ambulatory Visit: Payer: BC Managed Care – PPO | Admitting: Internal Medicine

## 2022-07-26 DIAGNOSIS — F418 Other specified anxiety disorders: Secondary | ICD-10-CM | POA: Diagnosis not present

## 2022-07-26 DIAGNOSIS — E221 Hyperprolactinemia: Secondary | ICD-10-CM | POA: Diagnosis not present

## 2022-07-26 DIAGNOSIS — Z23 Encounter for immunization: Secondary | ICD-10-CM | POA: Diagnosis not present

## 2022-08-21 DIAGNOSIS — J029 Acute pharyngitis, unspecified: Secondary | ICD-10-CM | POA: Diagnosis not present

## 2022-08-21 DIAGNOSIS — J069 Acute upper respiratory infection, unspecified: Secondary | ICD-10-CM | POA: Diagnosis not present

## 2022-08-21 DIAGNOSIS — B9689 Other specified bacterial agents as the cause of diseases classified elsewhere: Secondary | ICD-10-CM | POA: Diagnosis not present

## 2022-08-23 DIAGNOSIS — J069 Acute upper respiratory infection, unspecified: Secondary | ICD-10-CM | POA: Diagnosis not present

## 2022-09-13 ENCOUNTER — Other Ambulatory Visit (INDEPENDENT_AMBULATORY_CARE_PROVIDER_SITE_OTHER): Payer: BC Managed Care – PPO

## 2022-09-13 DIAGNOSIS — E010 Iodine-deficiency related diffuse (endemic) goiter: Secondary | ICD-10-CM | POA: Diagnosis not present

## 2022-09-13 DIAGNOSIS — E221 Hyperprolactinemia: Secondary | ICD-10-CM | POA: Diagnosis not present

## 2022-09-13 LAB — PROLACTIN: Prolactin: 21.9 ng/mL

## 2022-09-13 LAB — TSH: TSH: 1.48 u[IU]/mL (ref 0.35–5.50)

## 2022-09-13 LAB — T4, FREE: Free T4: 1.92 ng/dL — ABNORMAL HIGH (ref 0.60–1.60)

## 2022-09-13 LAB — T3, FREE: T3, Free: 4.4 pg/mL — ABNORMAL HIGH (ref 2.3–4.2)

## 2022-09-15 ENCOUNTER — Telehealth: Payer: Self-pay | Admitting: Internal Medicine

## 2022-09-15 NOTE — Telephone Encounter (Signed)
LMTCB

## 2022-09-15 NOTE — Telephone Encounter (Signed)
Her labs show that she that she has a bit too much thyroid hormone, I have sent her message through MyChart but she has not responded yet   Please contact the patient and ask her if she is specifically taking biotin, anything this is good for hair skin and nails, or collagen powder?   If she is not taking anything then I will need to do a brain MRI to check on her pituitary gland   Thanks

## 2022-09-15 NOTE — Telephone Encounter (Signed)
Detailed vm was left for patient to callback and answer question below. Also advised patient that she could respond to the [place] message as well.

## 2022-09-15 NOTE — Telephone Encounter (Signed)
Patient was advised that she needs to stop taking the biotin supplements 72 hrs prior to getting labs done.

## 2022-10-23 DIAGNOSIS — F418 Other specified anxiety disorders: Secondary | ICD-10-CM | POA: Diagnosis not present

## 2022-10-31 DIAGNOSIS — R112 Nausea with vomiting, unspecified: Secondary | ICD-10-CM | POA: Diagnosis not present

## 2022-10-31 DIAGNOSIS — R059 Cough, unspecified: Secondary | ICD-10-CM | POA: Diagnosis not present

## 2022-10-31 DIAGNOSIS — Z20822 Contact with and (suspected) exposure to covid-19: Secondary | ICD-10-CM | POA: Diagnosis not present

## 2022-11-10 ENCOUNTER — Other Ambulatory Visit: Payer: Self-pay | Admitting: Internal Medicine

## 2022-11-10 DIAGNOSIS — E010 Iodine-deficiency related diffuse (endemic) goiter: Secondary | ICD-10-CM

## 2022-11-10 DIAGNOSIS — E221 Hyperprolactinemia: Secondary | ICD-10-CM

## 2022-11-16 ENCOUNTER — Other Ambulatory Visit (INDEPENDENT_AMBULATORY_CARE_PROVIDER_SITE_OTHER): Payer: BC Managed Care – PPO

## 2022-11-16 DIAGNOSIS — E010 Iodine-deficiency related diffuse (endemic) goiter: Secondary | ICD-10-CM

## 2022-11-16 DIAGNOSIS — E221 Hyperprolactinemia: Secondary | ICD-10-CM | POA: Diagnosis not present

## 2022-11-16 LAB — TSH: TSH: 1.69 u[IU]/mL (ref 0.35–5.50)

## 2022-11-16 LAB — T4, FREE: Free T4: 2.3 ng/dL — ABNORMAL HIGH (ref 0.60–1.60)

## 2022-11-17 ENCOUNTER — Encounter: Payer: Self-pay | Admitting: Internal Medicine

## 2022-11-17 DIAGNOSIS — E0580 Other thyrotoxicosis without thyrotoxic crisis or storm: Secondary | ICD-10-CM

## 2022-11-17 LAB — PROLACTIN: Prolactin: 24.6 ng/mL

## 2022-12-05 ENCOUNTER — Telehealth: Payer: Self-pay

## 2022-12-05 NOTE — Telephone Encounter (Signed)
[facility] is needed approval for patient appointment tomorrow. They need by this afternoon or they will have to reschedule appointment.  CB [phone]

## 2022-12-06 ENCOUNTER — Other Ambulatory Visit: Payer: BC Managed Care – PPO

## 2022-12-06 ENCOUNTER — Telehealth: Payer: Self-pay

## 2022-12-06 ENCOUNTER — Encounter: Payer: Self-pay | Admitting: Internal Medicine

## 2022-12-06 NOTE — Telephone Encounter (Signed)
Patient was advised to call [facility] to reschedule appointment

## 2022-12-21 ENCOUNTER — Ambulatory Visit
Admission: RE | Admit: 2022-12-21 | Discharge: 2022-12-21 | Disposition: A | Discharge status: Home / Self Care | Payer: BC Managed Care – PPO | Source: Ambulatory Visit | Attending: Internal Medicine | Admitting: Internal Medicine

## 2022-12-21 ENCOUNTER — Telehealth: Payer: Self-pay

## 2022-12-21 DIAGNOSIS — N2581 Secondary hyperparathyroidism of renal origin: Secondary | ICD-10-CM | POA: Diagnosis not present

## 2022-12-21 DIAGNOSIS — C729 Malignant neoplasm of central nervous system, unspecified: Secondary | ICD-10-CM | POA: Diagnosis not present

## 2022-12-21 DIAGNOSIS — E0580 Other thyrotoxicosis without thyrotoxic crisis or storm: Secondary | ICD-10-CM

## 2022-12-21 MED ORDER — GADOPICLENOL 0.5 MMOL/ML IV SOLN
3.0000 mL | Freq: Once | INTRAVENOUS | Status: AC | PRN
  Administered 2022-12-21: 3 mL via INTRAVENOUS

## 2022-12-21 NOTE — Telephone Encounter (Signed)
[facility] calling stating that they still don't have an  authorization for patient  MR Brain W Wo Contrast.  They will have to cancel patient appointment for today. Give a call to [phone] asap

## 2022-12-25 NOTE — Telephone Encounter (Signed)
Called patient and sent a mychart message

## 2022-12-25 NOTE — Telephone Encounter (Signed)
Patient is calling to say that she got Dr. Quin Hoop message about her MRI results.  She tried to respond through [place], but was unable to get her message to go through.  Patient wants to know that since her Prolactin level is still off, but the MRI was normal if she should be considered for testing for PCOS since it runs in her family.

## 2023-01-18 ENCOUNTER — Encounter: Payer: Self-pay | Admitting: Internal Medicine

## 2023-01-18 ENCOUNTER — Telehealth (INDEPENDENT_AMBULATORY_CARE_PROVIDER_SITE_OTHER): Payer: BC Managed Care – PPO | Admitting: Internal Medicine

## 2023-01-18 ENCOUNTER — Encounter: Payer: Self-pay | Admitting: Neurology

## 2023-01-18 VITALS — Ht 62.0 in | Wt 142.0 lb

## 2023-01-18 DIAGNOSIS — R9089 Other abnormal findings on diagnostic imaging of central nervous system: Secondary | ICD-10-CM | POA: Diagnosis not present

## 2023-01-18 DIAGNOSIS — E0789 Other specified disorders of thyroid: Secondary | ICD-10-CM

## 2023-01-18 DIAGNOSIS — E010 Iodine-deficiency related diffuse (endemic) goiter: Secondary | ICD-10-CM | POA: Diagnosis not present

## 2023-01-18 DIAGNOSIS — E221 Hyperprolactinemia: Secondary | ICD-10-CM | POA: Diagnosis not present

## 2023-01-18 NOTE — Progress Notes (Signed)
Name: Mckenzie Brown  MRN/ DOB: [id number], 2000/12/04    Age/ Sex: 22 y.o., female     PCP: Fae Pippin   Reason for Endocrinology Evaluation: Hyperprolactinemia     Initial Endocrinology Clinic Visit: 09/11/2019    PATIENT IDENTIFIER: Mckenzie Brown is a 22 y.o., female with a past medical history of hyperprolactinemia . She has followed with [facility] since 09/11/2019 for consultative assistance with management of her hyperprolactinemia.   HISTORICAL SUMMARY: The patient was first diagnosed with hyperprolactinemia in 2018, with a max level of 51.2 NG/mL in January 2018.  MRI negative for pituitary pathology 2019 She was on OCPs from 2013-2018 She is G1P0  Mirena caused headaches Bromocriptine caused acne Cabergoline was stopped 05/2022   Patient has been noted with normal TSH and elevated free T4 and free T3 08/2021.  MRI did not reveal any pituitary adenoma 12/2022    She does have FH of thyroid disease   SUBJECTIVE:    Today (01/18/2023):  Mckenzie Brown is here for hyperprolactinemia and elevated FT4 and T3.  LMP 12/25/2022, occurring every 5-6 weeks Denies galactorrhea  Has chronic migraine headaches, no prior neurology eval  Denies sinus issues or congestion  Denies local neck swelling  Denies palpitations She is sexually active and uses condoms   She is concerned about PCOS , minimal acne but no hirsutism    HISTORY:  Past Medical History:  Past Medical History:  Diagnosis Date   Anxiety disorder of adolescence 04/12/2016   Major depressive disorder    Migraines    Suicide attempt by drug ingestion [facility])    Past Surgical History:  Past Surgical History:  Procedure Laterality Date   DILATION AND EVACUATION N/A 09/03/2015   Procedure: DILATATION AND EVACUATION;  Surgeon: Malachy Mood, MD;  Location: [facility];  Service: Gynecology;  Laterality: N/A;   dnc     TONSILLECTOMY N/A 04/25/2017   Procedure: TONSILLECTOMY;   Surgeon: Carloyn Manner, MD;  Location: [facility];  Service: ENT;  Laterality: N/A;   TONSILLECTOMY     WISDOM TOOTH EXTRACTION     Social History:  reports that she has never smoked. She has been exposed to tobacco smoke. She has never used smokeless tobacco. She reports that she does not drink alcohol and does not use drugs. Family History:  Family History  Problem Relation Age of Onset   Hypertension Mother    Anxiety disorder Mother    Depression Mother    Diabetes Maternal Grandmother    Hypertension Maternal Grandmother    Diabetes Maternal Grandfather    Parkinson's disease Maternal Grandfather    Other Neg Hx        pituitary disorder     HOME MEDICATIONS: Allergies as of 01/18/2023   No Known Allergies      Medication List        Accurate as of January 18, 2023  7:03 AM. If you have any questions, ask your nurse or doctor.          buPROPion 150 MG 24 hr tablet Commonly known as: WELLBUTRIN XL Take 150 mg by mouth every morning.          OBJECTIVE:   PHYSICAL EXAM: VS: There were no vitals taken for this visit.   EXAM: General: Pt appears well and is in NAD     DATA REVIEWED:  Latest Reference Range & Units 11/16/22 08:21  Prolactin ng/mL 24.6  TSH 0.35 - 5.50 uIU/mL  1.69  T4,Free(Direct) 0.60 - 1.60 ng/dL 2.30 (H)  (H): Data is abnormally high     Latest Reference Range & Units 07/14/22 09:01  Sodium 135 - 145 mEq/L 138  Potassium 3.5 - 5.1 mEq/L 4.6  Chloride 96 - 112 mEq/L 105  CO2 19 - 32 mEq/L 28  Glucose 70 - 99 mg/dL 91  BUN 6 - 23 mg/dL 20  Creatinine 0.40 - 1.20 mg/dL 0.75  Calcium 8.4 - 10.5 mg/dL 9.3  GFR >60.00 mL/min 112.91    Latest Reference Range & Units 07/14/22 09:01  Prolactin ng/mL 25.9  Glucose 70 - 99 mg/dL 91  TSH 0.35 - 5.50 uIU/mL 1.64  T4,Free(Direct) 0.60 - 1.60 ng/dL 1.82 (H)      Brain MRI 12/21/2022   Similar to the prior brain MRI of 09/07/2018, there are a few small foci of T2  FLAIR hyperintense signal abnormality scattered within the bilateral cerebral white matter (the largest within the left parietal lobe subcortical white matter measuring 2-3 mm).   There is no acute infarct.   No evidence of an intracranial mass.   No chronic intracranial blood products.   No extra-axial fluid collection.   No midline shift.   No pathologic intracranial enhancement identified.   Vascular: Maintained flow voids within the proximal large arterial vessels.   Skull and upper cervical spine: No focal suspicious marrow lesion.   Sinuses/Orbits: No mass or acute finding within the imaged orbits. 2 cm mucous retention cyst within the right maxillary sinus.   IMPRESSION: 1. No evidence of a pituitary adenoma. 2. Similar to the prior brain MRI of 09/08/2018, there are a few small foci of T2 FLAIR hyperintense signal abnormality scattered within the bilateral cerebral white matter (measuring up to 2-3 mm). These signal changes are nonspecific and differential considerations include sequelae of chronic migraine headaches, sequelae of a prior infectious/inflammatory process, sequelae of a demyelinating process or age advanced chronic small vessel ischemic disease, among others. 3. Otherwise unremarkable MRI appearance of the brain. 4. 2 cm mucous retention cyst within the right maxillary si  Thyroid ultrasound 09/14/2021 Estimated total number of nodules >/= 1 cm: 0   Number of spongiform nodules >/=  2 cm not described below (TR1): 0   Number of mixed cystic and solid nodules >/= 1.5 cm not described below (TR2): 0   _________________________________________________________   7 mm cystic nodule in the mid right thyroid lobe does not meet criteria for FNA or imaging surveillance.   IMPRESSION: 1. Moderate diffuse heterogeneity of the thyroid parenchyma. 2. Solitary subcentimeter cystic right thyroid nodule does not meet criteria for imaging surveillance or FNA.     ASSESSMENT / PLAN / RECOMMENDATIONS:   Hyperprolactinemia:   - No clinical evidence of hyperprolactinemia with lack of galactorrhea and regulation of menstrual cycle  - She has been without cabergoline since ~06/2022 - Repeat prolactin remain snormal  - MRI negative for pituitary adenoma 2024    2. Thyromegaly :  - Reviewed thyroid ultrasound , right sub-centimeter nodule noted - No indications for serial monitoring  - No local neck symptoms  - Pt has been noted with elevated FT4 but normal TSH.   3. Elevated FT4   - Most likely this is due to resistance to thyroid hormone, another differential diagnosis is  TSH producing adenoma .But her most recent MRI in 2024 did NOT repeat any adenoma - She is clinically euthyroid  - Will check Alpha protein in the future  4. Concerns about PCOS :  - Menstruations have been between 35-42 days  - Minimal acne and no hirsutism - No ovarian multiple cysts on pelvic ultrasound in the past  - I personally have a very low suspicion for PCOS and she was encouraged to follow a healthy lifestyle    5. Abnormal MRI of the Brain :   -  Pt with chronic headaches  - Recent brain MRI showed few small foci of T2 FLAIR hyperintense signal abnormality scattered within the bilateral cerebral white matter - Will refer to neurology    6. Sinus retention cyst:  - She is asymptomatic  - No ENT referral at this time unless she becomes symptomatic   F/U in 6 months   Signed electronically by: Mack Guise, MD  [facility]  [facility] [address] [place], [place] [address] Phone: [phone] FAX: [phone]      CC: Fae Pippin [place] [address] Phone: [phone]  Fax: [phone]   Return to Endocrinology clinic as below: Future Appointments  Date Time Provider [place]  01/18/2023  7:30 AM Demitri Kucinski, Melanie Crazier, MD [id number]  None

## 2023-02-27 DIAGNOSIS — E0590 Thyrotoxicosis, unspecified without thyrotoxic crisis or storm: Secondary | ICD-10-CM | POA: Diagnosis not present

## 2023-02-27 DIAGNOSIS — F418 Other specified anxiety disorders: Secondary | ICD-10-CM | POA: Diagnosis not present

## 2023-02-27 DIAGNOSIS — R9082 White matter disease, unspecified: Secondary | ICD-10-CM | POA: Diagnosis not present

## 2023-03-19 NOTE — Progress Notes (Unsigned)
NEUROLOGY CONSULTATION NOTE  Mckenzie Brown MRN: [id number] DOB: 2001/01/31  Referring provider: Terrace Arabia, MD Primary care provider: [name], PA-C  Reason for consult:  headache  Assessment/Plan:   Migraine without aura, without status migrainosus, not intractable White matter changes on brain MRI - very mild.  Nonspecific but likely related to migraines   Migraine prevention:  She would like to try lifestyle modification first: Caffeine cessation Diet Exercise Improve sleep hygiene Supplements (if okay with endocrinology) - Migrelief, CoQ10 Eye exam Limit use of pain relievers to no more than 2 days out of week to prevent risk of rebound or medication-overuse headache. Keep headache diary Follow up 6 months.    Subjective:  Mckenzie Brown is a 22 year old female with hyperprolactinemia, depression and anxiety who presents for headaches.  History supplemented by her accompanying mother and referring provider's note.  Headaches since age 68 or 22 years old with onset of menses.  She had severe migraines up through high school.  They have since lessened.  Headaches are a 3-4/10 pressure-like may occur across the forehead or temples.  Sometimes sees white spots. Associated with nausea, photophobia, and phonophobia.  They last a couple of hours but may last a day.  They occur 2-3 times a week.  She gets a severe headache every 2-3 weeks.  Doesn't always treat them but if more severe, may take a Tylenol.  Stress is a trigger.  Resting in bed or eating/drinking helps relieve them.  She was diagnosed with hyperprolactinemia in 2018.  On cabergoline until July 2023.  Currently without clinical evidence of hyperprolactinemia.  MRI of brain with and without contrast on 12/21/2022 personally reviewed revealed few scattered nonspecific T2 FLAIR hyperintense punctate foci within the bilateral cerebral white matter, stable compared to prior MRI from 09/08/18, but no evidence of  pituitary adenoma.    Past NSAIDS/analgesics:  ibuprofen Past abortive triptans:  sumatriptan 100mg  Past abortive ergotamine:  none Past muscle relaxants:  none Past anti-emetic:  Zofran Past antihypertensive medications:  none Past antidepressant medications:  sertraline Past anticonvulsant medications:  none Past anti-CGRP:  none Past vitamins/Herbal/Supplements:  none Past antihistamines/decongestants:  none Other past therapies:  none  Current NSAIDS/analgesics:  Tylenol Current triptans:  none Current ergotamine:  none Current anti-emetic:  none Current muscle relaxants:  none Current Antihypertensive medications:  none Current Antidepressant medications:  Wellbutrin XL 150mg  daily Current Anticonvulsant medications:  none Current anti-CGRP:  none Current Vitamins/Herbal/Supplements:  none Current Antihistamines/Decongestants:  none Other therapy:  none Birth control:  none Other medications:  buspirone   Caffeine:  2 espresso shots every morning.  1 more coffee drink later.  Diet:  100 oz water daily.  Skips meals.  No junk food.  Lost 30 lbs over last 1 1/2 years.  High protein, low carb diet.   Exercise:  walking Depression/anxiety:  yes.  Improved on medication. Sleep hygiene:  poor.  Trouble falling asleep.  Averages 6.5 hours a night.   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   Major depressive disorder    Migraines    Suicide attempt by drug ingestion (HCC)     PAST SURGICAL HISTORY: Past Surgical History:  Procedure Laterality Date   DILATION AND EVACUATION N/A 09/03/2015   Procedure: DILATATION AND EVACUATION;  Surgeon: Vena Austria, MD;  Location: [facility];  Service: Gynecology;  Laterality: N/A;   dnc     TONSILLECTOMY N/A 04/25/2017   Procedure: TONSILLECTOMY;  Surgeon: Bud Face, MD;  Location: [facility];   Service: ENT;  Laterality: N/A;   TONSILLECTOMY     WISDOM TOOTH EXTRACTION      MEDICATIONS: Current Outpatient Medications on File Prior to Visit  Medication Sig Dispense Refill   buPROPion (WELLBUTRIN XL) 150 MG 24 hr tablet Take 150 mg by mouth every morning.     busPIRone (BUSPAR) 5 MG tablet Take 5 mg by mouth 2 (two) times daily.     No current facility-administered medications on file prior to visit.    ALLERGIES: No Known Allergies  FAMILY HISTORY: Family History  Problem Relation Age of Onset   Hypertension Mother    Anxiety disorder Mother    Depression Mother    Diabetes Maternal Grandmother    Hypertension Maternal Grandmother    Diabetes Maternal Grandfather    Parkinson's disease Maternal Grandfather    Other Neg Hx        pituitary disorder    Objective:  Blood pressure 114/82, pulse 74, height 5\' 2"  (1.575 m), weight 144 lb 12.8 oz (65.7 kg), SpO2 98 %. General: No acute distress.  Patient appears well-groomed.   Head:  Normocephalic/atraumatic Eyes:  fundi examined but not visualized Neck: supple, no paraspinal tenderness, full range of motion Heart: regular rate and rhythm Neurological Exam: Mental status: alert and oriented to person, place, and time, speech fluent and not dysarthric, language intact. Cranial nerves: CN I: not tested CN II: pupils equal, round and reactive to light, visual fields intact CN III, IV, VI:  full range of motion, no nystagmus, no ptosis CN V: facial sensation intact. CN VII: upper and lower face symmetric CN VIII: hearing intact CN IX, X: gag intact, uvula midline CN XI: sternocleidomastoid and trapezius muscles intact CN XII: tongue midline Bulk & Tone: normal, no fasciculations. Motor:  muscle strength 5/5 throughout Sensation:  Pinprick, temperature and vibratory sensation intact. Deep Tendon Reflexes:  2+ throughout,  toes downgoing.   Finger to nose testing:  Without dysmetria.   Heel to shin:  Without  dysmetria.   Gait:  Normal station and stride.  Romberg negative.    Thank you for allowing me to take part in the care of this patient.  Shon Millet, DO  CC:  Terrace Arabia, MD  [name], PA-C

## 2023-03-20 ENCOUNTER — Encounter: Payer: Self-pay | Admitting: Neurology

## 2023-03-20 ENCOUNTER — Ambulatory Visit (INDEPENDENT_AMBULATORY_CARE_PROVIDER_SITE_OTHER): Payer: BC Managed Care – PPO | Admitting: Neurology

## 2023-03-20 VITALS — BP 114/82 | HR 74 | Ht 62.0 in | Wt 144.8 lb

## 2023-03-20 DIAGNOSIS — G43009 Migraine without aura, not intractable, without status migrainosus: Secondary | ICD-10-CM | POA: Diagnosis not present

## 2023-03-20 NOTE — Patient Instructions (Signed)
  Limit use of pain relievers to no more than 2 days out of the week.  These medications include acetaminophen, NSAIDs (ibuprofen/Advil/Motrin, naproxen/Aleve, triptans (Imitrex/sumatriptan), Excedrin, and narcotics.  This will help reduce risk of rebound headaches. Routine exercise Stay adequately hydrated (aim for 64 oz water daily) Keep headache diary Maintain proper stress management Maintain proper sleep hygiene Do not skip meals Consider supplements:  Migrelief combination pill, Coenzyme Q10 300mg  daily - Make sure it is okay with your endocrinologist

## 2023-05-29 ENCOUNTER — Ambulatory Visit: Payer: BC Managed Care – PPO | Admitting: Neurology

## 2023-09-19 ENCOUNTER — Ambulatory Visit: Payer: BC Managed Care – PPO | Admitting: Neurology

## 2023-10-15 ENCOUNTER — Encounter: Payer: Self-pay | Admitting: Internal Medicine

## 2023-10-15 DIAGNOSIS — E221 Hyperprolactinemia: Secondary | ICD-10-CM | POA: Diagnosis not present

## 2023-10-15 DIAGNOSIS — E0590 Thyrotoxicosis, unspecified without thyrotoxic crisis or storm: Secondary | ICD-10-CM | POA: Diagnosis not present

## 2023-10-15 DIAGNOSIS — F418 Other specified anxiety disorders: Secondary | ICD-10-CM | POA: Diagnosis not present

## 2023-10-15 NOTE — Telephone Encounter (Signed)
Patient states that she just had labs done at her PCP's office and her PCP will fax her results to this office when they get them.  Patient has been added to the waitlist for a virtual visit when we get a cancellation.

## 2023-10-16 ENCOUNTER — Encounter: Payer: Self-pay | Admitting: Internal Medicine

## 2023-10-18 ENCOUNTER — Ambulatory Visit: Payer: BC Managed Care – PPO | Admitting: Internal Medicine

## 2023-10-22 ENCOUNTER — Telehealth (INDEPENDENT_AMBULATORY_CARE_PROVIDER_SITE_OTHER): Payer: BC Managed Care – PPO | Admitting: Internal Medicine

## 2023-10-22 VITALS — Ht 62.0 in

## 2023-10-22 DIAGNOSIS — E221 Hyperprolactinemia: Secondary | ICD-10-CM | POA: Diagnosis not present

## 2023-10-22 DIAGNOSIS — E010 Iodine-deficiency related diffuse (endemic) goiter: Secondary | ICD-10-CM

## 2023-10-22 DIAGNOSIS — E0789 Other specified disorders of thyroid: Secondary | ICD-10-CM

## 2023-10-22 NOTE — Progress Notes (Signed)
Virtual Visit via Video Note  I connected with Mckenzie Brown on 10/22/23  at 7:30 AM  by a video enabled telemedicine application and verified that I am speaking with the correct person using two identifiers.   I discussed the limitations of evaluation and management by telemedicine and the availability of in person appointments. The patient expressed understanding and agreed to proceed.   -Location of the patient : Car -Location of the provider : Office -The names of all persons participating in the telemedicine service : Pt and myself         Name: Mckenzie Brown  MRN/ DOB: [id number], 06/05/2001    Age/ Sex: 22 y.o., female     PCP: Arlyss Queen   Reason for Endocrinology Evaluation: Hyperprolactinemia     Initial Endocrinology Clinic Visit: 09/11/2019    PATIENT IDENTIFIER: [name] is a 22 y.o., female with a past medical history of hyperprolactinemia . She has followed with [facility] since 09/11/2019 for consultative assistance with management of her hyperprolactinemia.   HISTORICAL SUMMARY: The patient was first diagnosed with hyperprolactinemia in 2018, with a max level of 51.2 NG/mL in January 2018.  MRI negative for pituitary pathology 2019 She was on OCPs from 2013-2018 She is G1P0  Mirena caused headaches Bromocriptine caused acne Cabergoline was stopped 05/2022   Patient has been noted with normal TSH and elevated free T4 and free T3 08/2021.  MRI did not reveal any pituitary adenoma 12/2022   Paternal history of thyroid disease  SUBJECTIVE:    Today (10/22/2023):  Mckenzie Brown is here for hyperprolactinemia and elevated FT4 and T3.  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   Major depressive disorder    Migraines    Suicide attempt by drug ingestion [facility])    Past Surgical History:  Past Surgical History:  Procedure Laterality Date   DILATION AND EVACUATION N/A 09/03/2015   Procedure: DILATATION AND EVACUATION;  Surgeon: Vena Austria, MD;  Location: [facility];  Service: Gynecology;  Laterality: N/A;   dnc     TONSILLECTOMY N/A 04/25/2017   Procedure: TONSILLECTOMY;  Surgeon: Bud Face, MD;  Location: [facility];  Service: ENT;  Laterality: N/A;   TONSILLECTOMY     WISDOM TOOTH EXTRACTION     Social History:  reports that she has never smoked. She has been exposed to tobacco smoke. She has never used smokeless tobacco. She reports that she does not drink alcohol and does not use drugs. Family History:  Family History  Problem Relation Age of Onset   Migraines Mother    Hypertension Mother    Anxiety disorder Mother    Depression Mother    Parkinsonism Maternal Grandmother    Migraines Maternal Grandmother    Dementia Maternal Grandmother    Diabetes Maternal Grandmother    Hypertension Maternal Grandmother    Seizures Maternal Grandfather    Parkinsonism Maternal Grandfather    Dementia Maternal Grandfather    Diabetes Maternal Grandfather    Parkinson's disease Maternal Grandfather    Other Neg Hx  pituitary disorder     HOME MEDICATIONS: Allergies as of 10/22/2023   No Known Allergies      Medication List        Accurate as of October 22, 2023  7:28 AM. If you have any questions, ask your nurse or doctor.          buPROPion 300 MG 24 hr tablet Commonly known as: WELLBUTRIN XL Take 300 mg by mouth daily. What changed: Another medication with the same name was removed. Continue taking this medication, and follow  the directions you see here. Changed by: Johnney Ou Meli Faley   busPIRone 10 MG tablet Commonly known as: BUSPAR Take 10 mg by mouth 2 (two) times daily. What changed: Another medication with the same name was removed. Continue taking this medication, and follow the directions you see here. Changed by: Johnney Ou Aarya Quebedeaux          OBJECTIVE:   PHYSICAL EXAM: VS: Ht 5\' 2"  (1.575 m)   BMI 26.48 kg/m    EXAM: General: Pt appears well and is in NAD     DATA REVIEWED:  Latest Reference Range & Units 11/16/22 08:21  Prolactin ng/mL 24.6  TSH 0.35 - 5.50 uIU/mL 1.69  T4,Free(Direct) 0.60 - 1.60 ng/dL 6.01 (H)  (H): Data is abnormally high     Latest Reference Range & Units 07/14/22 09:01  Sodium 135 - 145 mEq/L 138  Potassium 3.5 - 5.1 mEq/L 4.6  Chloride 96 - 112 mEq/L 105  CO2 19 - 32 mEq/L 28  Glucose 70 - 99 mg/dL 91  BUN 6 - 23 mg/dL 20  Creatinine 0.93 - 2.35 mg/dL 5.73  Calcium 8.4 - 22.0 mg/dL 9.3  GFR >25.42 mL/min 112.91    Latest Reference Range & Units 07/14/22 09:01  Prolactin ng/mL 25.9  Glucose 70 - 99 mg/dL 91  TSH 7.06 - 2.37 uIU/mL 1.64  T4,Free(Direct) 0.60 - 1.60 ng/dL 6.28 (H)      Brain MRI 12/21/2022   Similar to the prior brain MRI of 09/07/2018, there are a few small foci of T2 FLAIR hyperintense signal abnormality scattered within the bilateral cerebral white matter (the largest within the left parietal lobe subcortical white matter measuring 2-3 mm).   There is no acute infarct.   No evidence of an intracranial mass.   No chronic intracranial blood products.   No extra-axial fluid collection.   No midline shift.   No pathologic intracranial enhancement identified.   Vascular: Maintained flow voids within the proximal large arterial vessels.   Skull and upper cervical spine: No focal suspicious marrow lesion.   Sinuses/Orbits: No mass or acute finding within the imaged orbits. 2 cm mucous retention cyst within the  right maxillary sinus.   IMPRESSION: 1. No evidence of a pituitary adenoma. 2. Similar to the prior brain MRI of 09/08/2018, there are a few small foci of T2 FLAIR hyperintense signal abnormality scattered within the bilateral cerebral white matter (measuring up to 2-3 mm). These signal changes are nonspecific and differential considerations include sequelae of chronic migraine headaches, sequelae of a prior infectious/inflammatory process, sequelae of a demyelinating process or age advanced chronic small vessel ischemic disease, among others. 3. Otherwise unremarkable MRI appearance of the brain. 4. 2 cm mucous retention cyst within the right maxillary si  Thyroid ultrasound 09/14/2021 Estimated total number of nodules >/= 1 cm: 0   Number of spongiform nodules >/=  2 cm not described below (TR1): 0   Number of mixed cystic  and solid nodules >/= 1.5 cm not described below (TR2): 0   _________________________________________________________   7 mm cystic nodule in the mid right thyroid lobe does not meet criteria for FNA or imaging surveillance.   IMPRESSION: 1. Moderate diffuse heterogeneity of the thyroid parenchyma. 2. Solitary subcentimeter cystic right thyroid nodule does not meet criteria for imaging surveillance or FNA.    ASSESSMENT / PLAN / RECOMMENDATIONS:   Hyperprolactinemia:   - No clinical evidence of hyperprolactinemia with lack of galactorrhea and regulation of menstrual cycle  - She has been off cabergoline since ~06/2022 - MRI negative for pituitary adenoma 2024 -Will repeat prolactin as well as saliva cortisol testing for Cushing disease    2. Thyromegaly :  - Reviewed thyroid ultrasound , right sub-centimeter nodule noted - No indications for serial monitoring  - No local neck symptoms  - Pt has been noted with elevated FT4 but normal TSH.   3. Elevated FT4   - Most likely this is due to resistance to thyroid hormone, another differential  diagnosis is  TSH producing adenoma .But her most recent MRI in 2024 did NOT repeat any adenoma -She has been noted with anxiety but no other clinical evidence of hyperthyroidism - Will check Alpha subunit    F/U in 6 months   Patient will contact office to schedule a lab appointment  Signed electronically by: Lyndle Herrlich, MD  [facility] Endocrinology  [facility] [address] [place], [place] [address] Phone: [phone] FAX: [phone]      CC: Arlyss Queen [address] [place] [address] Phone: [phone]  Fax: [phone]   Return to Endocrinology clinic as below: Future Appointments  Date Time Provider Department Center  10/22/2023  7:30 AM Liara Holm, Konrad Dolores, MD [facility] None  12/03/2023  8:30 AM Drema Dallas, DO [facility] None

## 2023-11-12 ENCOUNTER — Other Ambulatory Visit: Payer: Self-pay

## 2023-11-16 ENCOUNTER — Other Ambulatory Visit: Payer: BC Managed Care – PPO

## 2023-11-16 DIAGNOSIS — E237 Disorder of pituitary gland, unspecified: Secondary | ICD-10-CM | POA: Diagnosis not present

## 2023-11-16 DIAGNOSIS — E0789 Other specified disorders of thyroid: Secondary | ICD-10-CM | POA: Diagnosis not present

## 2023-11-19 ENCOUNTER — Telehealth: Payer: Self-pay | Admitting: Internal Medicine

## 2023-11-19 NOTE — Telephone Encounter (Signed)
 Can you please check with the phlebotomist and see if they have been able to add alpha-fetoprotein to the system or did they send it to [facility]?    Thanks

## 2023-11-23 LAB — ACTH: C206 ACTH: 13 pg/mL (ref 6–50)

## 2023-11-23 LAB — ESTRADIOL: Estradiol: 42 pg/mL

## 2023-11-23 LAB — FOLLICLE STIMULATING HORMONE: FSH: 9.8 m[IU]/mL

## 2023-11-23 LAB — BASIC METABOLIC PANEL
BUN: 19 mg/dL (ref 7–25)
CO2: 28 mmol/L (ref 20–32)
Calcium: 9.8 mg/dL (ref 8.6–10.2)
Chloride: 104 mmol/L (ref 98–110)
Creat: 0.75 mg/dL (ref 0.50–0.96)
Glucose, Bld: 67 mg/dL (ref 65–99)
Potassium: 4.1 mmol/L (ref 3.5–5.3)
Sodium: 140 mmol/L (ref 135–146)

## 2023-11-23 LAB — T4, FREE: Free T4: 2.2 ng/dL — ABNORMAL HIGH (ref 0.8–1.8)

## 2023-11-23 LAB — TSH: TSH: 1.92 m[IU]/L

## 2023-11-23 LAB — INSULIN-LIKE GROWTH FACTOR
IGF-I, LC/MS: 162 ng/mL (ref 83–456)
Z-Score (Female): -0.5 {STDV} (ref ?–2.0)

## 2023-11-23 LAB — PROLACTIN: Prolactin: 16.2 ng/mL

## 2023-11-23 LAB — CORTISOL: Cortisol, Plasma: 7.6 ug/dL

## 2023-11-23 LAB — T3, FREE: T3, Free: 5.4 pg/mL — ABNORMAL HIGH (ref 2.3–4.2)

## 2023-11-26 ENCOUNTER — Encounter: Payer: Self-pay | Admitting: Internal Medicine

## 2023-11-30 NOTE — Progress Notes (Unsigned)
NEUROLOGY FOLLOW UP OFFICE NOTE  Mckenzie Brown [id number]  Assessment/Plan:   Migraine without aura, without status migrainosus, not intractable White matter changes on brain MRI - very mild.  Nonspecific but likely related to migraines Morning headaches, snoring, daytime sleepiness - query sleep apnea   Refer to sleep medicine for evaluation of sleep apnea Migraine prevention:  While her thyroid workup is still in progress, she defers starting a medication and would like to continue making lifestyle modifications and see results of sleep apnea workup: Diet Exercise Improve sleep hygiene Supplements (magnesium citrate, riboflavin, CoQ10) Limit use of pain relievers to no more than 2 days out of week to prevent risk of rebound or medication-overuse headache. Keep headache diary Follow up 6 months.  Total time spent in chart and face to face with patient:  16 minutes.  Subjective:  Mckenzie Brown is a 23 year old female with hyperprolactinemia, depression and anxiety who follows up for migraines.  UPDATE: Tried lifestyle modification.   Weaned off of caffeine and now on decaf coffee Limiting dairy except for protein drink and milk for coffee.  Otherwise, drinks water all day.  Sleep has gotten worse.  Hasn't been able to fall asleep until 3 AM.  Gets 5-6 hours a night.  Excessive sleepiness.  Does snore.  PCP thinks it is due to anxiety, so buspirone was increased. She hasn't been exercising recently.  Followed by endocrinology.  Still working up for thyroid hormone resistance.    Intensity:  6/10 Duration:  Tries not to take anything.  If not resolved in 2-3 hours, will take a Tylenol Frequency:  every other morning.  Wakes up with headache. Current NSAIDS/analgesics:  Tylenol Current triptans:  none Current ergotamine:  none Current anti-emetic:  none Current muscle relaxants:  none Current Antihypertensive medications:  none Current Antidepressant medications:   Wellbutrin XL 150mg  daily Current Anticonvulsant medications:  none Current anti-CGRP:  none Current Vitamins/Herbal/Supplements:  none Current Antihistamines/Decongestants:  none Other therapy:  none Birth control:  none Other medications:  buspirone   Caffeine:  2 espresso shots every morning.  1 more coffee drink later.  Diet:  100 oz water daily.  Skips meals.  No junk food.  Lost 30 lbs over last 1 1/2 years.  High protein, low carb diet.   Exercise:  walking Depression/anxiety:  yes.  Improved on medication. Sleep hygiene:  poor.  Trouble falling asleep.  Averages 6.5 hours a night.  HISTORY: Headaches since age 45 or 23 years old with onset of menses.  She had severe migraines up through high school.  They have since lessened.  Headaches are a 3-4/10 pressure-like may occur across the forehead or temples.  Sometimes sees white spots. Associated with nausea, photophobia, and phonophobia.  They last a couple of hours but may last a day.  They occur 2-3 times a week.  She gets a severe headache every 2-3 weeks.  Doesn't always treat them but if more severe, may take a Tylenol.  Stress is a trigger.  Resting in bed or eating/drinking helps relieve them.  She was diagnosed with hyperprolactinemia in 2018.  On cabergoline until July 2023.  Currently without clinical evidence of hyperprolactinemia.  MRI of brain with and without contrast on 12/21/2022 personally reviewed revealed few scattered nonspecific T2 FLAIR hyperintense punctate foci within the bilateral cerebral white matter, stable compared to prior MRI from 09/08/18, but no evidence of pituitary adenoma.    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     busPIRone (BUSPAR) 10 MG tablet Take 10 mg by mouth 2 (two) times daily.     No current facility-administered medications on file prior to visit.    ALLERGIES: No Known Allergies  FAMILY HISTORY: Family History  Problem Relation Age of Onset   Migraines Mother    Hypertension Mother    Anxiety disorder Mother    Depression Mother    Parkinsonism Maternal Grandmother    Migraines Maternal Grandmother    Dementia Maternal Grandmother    Diabetes Maternal Grandmother    Hypertension Maternal Grandmother    Seizures Maternal Grandfather    Parkinsonism Maternal Grandfather    Dementia Maternal Grandfather    Diabetes Maternal Grandfather    Parkinson's disease Maternal Grandfather    Other Neg Hx        pituitary disorder      Objective:  Blood pressure 102/71, pulse 95, height 5\' 2"  (1.575 m), weight 164 lb (74.4 kg), SpO2 99%. General: No acute distress.  Patient appears well-groomed.      Mckenzie Millet, DO  CC: Mckenzie Peak, PA-C

## 2023-12-03 ENCOUNTER — Encounter: Payer: Self-pay | Admitting: Neurology

## 2023-12-03 ENCOUNTER — Ambulatory Visit (INDEPENDENT_AMBULATORY_CARE_PROVIDER_SITE_OTHER): Payer: BC Managed Care – PPO | Admitting: Neurology

## 2023-12-03 VITALS — BP 102/71 | HR 95 | Ht 62.0 in | Wt 164.0 lb

## 2023-12-03 DIAGNOSIS — G4719 Other hypersomnia: Secondary | ICD-10-CM | POA: Diagnosis not present

## 2023-12-03 DIAGNOSIS — R519 Headache, unspecified: Secondary | ICD-10-CM

## 2023-12-03 DIAGNOSIS — G43009 Migraine without aura, not intractable, without status migrainosus: Secondary | ICD-10-CM

## 2023-12-03 DIAGNOSIS — E0789 Other specified disorders of thyroid: Secondary | ICD-10-CM | POA: Diagnosis not present

## 2023-12-03 NOTE — Patient Instructions (Signed)
  Refer to sleep medicine Limit use of pain relievers to no more than 10 days out of the month.  These medications include acetaminophen, NSAIDs (ibuprofen/Advil/Motrin, naproxen/Aleve, triptans (Imitrex/sumatriptan), Excedrin, and narcotics.  This will help reduce risk of rebound headaches. Be aware of common food triggers Routine exercise Stay adequately hydrated (aim for 64 oz water daily) Keep headache diary Maintain proper stress management Maintain proper sleep hygiene Do not skip meals Consider supplements:  magnesium citrate 400mg  daily, riboflavin 400mg  daily, coenzyme Q10 300mg  daily

## 2023-12-09 LAB — CORTISOL, LC/MS, SALIVA, 2 SAMPLES
Cortisol Savliva Sample: <0.03 ug/dL
Cortisol, Saliva Sample: <0.03 ug/dL
Draw Date: 11625
Draw Date: 11825

## 2023-12-21 DIAGNOSIS — J069 Acute upper respiratory infection, unspecified: Secondary | ICD-10-CM | POA: Diagnosis not present

## 2023-12-21 DIAGNOSIS — R6889 Other general symptoms and signs: Secondary | ICD-10-CM | POA: Diagnosis not present

## 2023-12-24 DIAGNOSIS — J069 Acute upper respiratory infection, unspecified: Secondary | ICD-10-CM | POA: Diagnosis not present

## 2023-12-24 DIAGNOSIS — H6692 Otitis media, unspecified, left ear: Secondary | ICD-10-CM | POA: Diagnosis not present

## 2023-12-24 DIAGNOSIS — H1033 Unspecified acute conjunctivitis, bilateral: Secondary | ICD-10-CM | POA: Diagnosis not present

## 2023-12-24 DIAGNOSIS — R0981 Nasal congestion: Secondary | ICD-10-CM | POA: Diagnosis not present

## 2024-01-09 ENCOUNTER — Ambulatory Visit: Admitting: Sleep Medicine

## 2024-01-09 ENCOUNTER — Encounter: Payer: Self-pay | Admitting: Sleep Medicine

## 2024-01-09 VITALS — BP 110/78 | HR 87 | Temp 97.3°F | Ht 62.0 in | Wt 166.4 lb

## 2024-01-09 DIAGNOSIS — E66811 Obesity, class 1: Secondary | ICD-10-CM

## 2024-01-09 DIAGNOSIS — Z6830 Body mass index (BMI) 30.0-30.9, adult: Secondary | ICD-10-CM

## 2024-01-09 DIAGNOSIS — G4733 Obstructive sleep apnea (adult) (pediatric): Secondary | ICD-10-CM

## 2024-01-09 DIAGNOSIS — R0683 Snoring: Secondary | ICD-10-CM | POA: Diagnosis not present

## 2024-01-09 DIAGNOSIS — R400 Somnolence: Secondary | ICD-10-CM | POA: Diagnosis not present

## 2024-01-09 DIAGNOSIS — F411 Generalized anxiety disorder: Secondary | ICD-10-CM

## 2024-01-09 NOTE — Patient Instructions (Signed)
 Mckenzie Brown

## 2024-01-09 NOTE — Progress Notes (Signed)
 Name:Mckenzie Brown MRN: [id number] DOB: Jul 24, 2001   CHIEF COMPLAINT:  EXCESSIVE DAYTIME SLEEPINESS   HISTORY OF PRESENT ILLNESS:  [name] is a 23 y.o. w/ a h/o anxiety, depression and obesity who presents for c/o loud snoring and excessive daytime sleepiness which has been present for several years. Denies nocturnal awakenings. Reports a 35 lb weight gain over the last 6 months. Admits to morning headaches and dry mouth. Denies night sweats, RLS symptoms, dream enactment, cataplexy, hypnagogic or hypnapompic hallucinations. Reports a family history of sleep apnea. Denies drowsy driving. Drinks 2 decaf espresso shots daily, denies alcohol, tobacco or illicit drug use.   Bedtime 11:30 pm Sleep onset 60 mins Rise time 4:30-6 am   EPWORTH SLEEP SCORE 10    01/09/2024    3:00 PM  Results of the Epworth flowsheet  Sitting and reading 3  Watching TV 2  Sitting, inactive in a public place (e.g. 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  has a past surgical history that includes Dilation and evacuation (N/A, 09/03/2015); Tonsillectomy (N/A, 04/25/2017); Tonsillectomy; dnc; and Wisdom tooth extraction. Prior to Admission medications   Medication Sig Start Date End Date Taking? Authorizing Provider  Biotin w/ Vitamins C & E (HAIR SKIN & NAILS GUMMIES PO) Take by mouth.   Yes [provider]  buPROPion (WELLBUTRIN XL) 300 MG 24 hr tablet Take 300 mg by mouth daily. 10/15/23  Yes [provider]  busPIRone (BUSPAR) 10 MG  tablet Take 10 mg by mouth 2 (two) times daily. 10/15/23  Yes [provider]   No Known Allergies  FAMILY HISTORY:  family history includes Anxiety disorder in her mother; Dementia in her maternal grandfather and maternal grandmother; Depression in her mother; Diabetes in her maternal grandfather and maternal grandmother; Hypertension in her maternal grandmother and mother; Migraines in her maternal grandmother and mother; Parkinson's disease in her maternal grandfather; Parkinsonism in her maternal grandfather and maternal grandmother; Seizures in her maternal grandfather. SOCIAL HISTORY:  reports that she has never smoked. She has been exposed to tobacco smoke. She has never used smokeless tobacco. She reports that she does not drink alcohol and does not use drugs.   Review of Systems:  Gen:  Denies  fever, sweats, chills weight loss  HEENT: Denies blurred vision, double vision, ear pain, eye pain, hearing loss, nose bleeds, sore throat Cardiac:  No dizziness, chest pain or heaviness, chest tightness,edema, No JVD Resp:   No cough, -sputum production, -shortness of breath,-wheezing, -hemoptysis,  Gi: Denies swallowing difficulty, stomach pain, nausea or vomiting, diarrhea, constipation, bowel incontinence Gu:  Denies bladder incontinence, burning urine Ext:   Denies Joint pain, stiffness or swelling Skin: Denies  skin rash, easy bruising or bleeding or hives Endoc:  Denies polyuria, polydipsia , polyphagia or weight change Psych:   Denies depression, insomnia or hallucinations  Other:  All other systems negative  VITAL SIGNS: BP 110/78 (BP Location: Right Arm, Cuff Size:  Normal)   Pulse 87   Temp (!) 97.3 F (36.3 C)   Ht 5\' 2"  (1.575 m)   Wt 166 lb 6.4 oz (75.5 kg)   SpO2 98%   BMI 30.43 kg/m    Physical Examination:   General Appearance: No distress  EYES PERRLA, EOM intact.   NECK Supple, No JVD Mallampati IV Pulmonary: normal breath sounds, No wheezing.   CardiovascularNormal S1,S2.  No m/r/g.   Abdomen: Benign, Soft, non-tender. Skin:   warm, no rashes, no ecchymosis  Extremities: normal, no cyanosis, clubbing. Neuro:without focal findings,  speech normal  PSYCHIATRIC: Mood, affect within normal limits.   ASSESSMENT AND PLAN  OSA I suspect that OSA is likely present due to clinical presentation. Discussed the consequences of untreated sleep apnea. Advised not to drive drowsy for safety of patient and others. Will complete further evaluation with a home sleep study and follow up to review results.    Anxiety Stable, on current management. Following with PCP.   Depression Stable, on current management.   Obesity Counseled patient on diet and lifestyle modification.    MEDICATION ADJUSTMENTS/LABS AND TESTS ORDERED: Recommend Sleep Study   Patient  satisfied with Plan of action and management. All questions answered  Follow up to review HST results and treatment plan.   I spent a total of 46 minutes reviewing chart data, face-to-face evaluation with the patient, counseling and coordination of care as detailed above.    Tempie Hoist, M.D.  Sleep Medicine [facility]

## 2024-01-14 ENCOUNTER — Ambulatory Visit: Payer: BC Managed Care – PPO | Admitting: Internal Medicine

## 2024-01-16 ENCOUNTER — Other Ambulatory Visit: Payer: Self-pay | Admitting: Internal Medicine

## 2024-01-16 DIAGNOSIS — E0789 Other specified disorders of thyroid: Secondary | ICD-10-CM

## 2024-01-17 ENCOUNTER — Other Ambulatory Visit: Payer: Self-pay

## 2024-01-17 DIAGNOSIS — E0789 Other specified disorders of thyroid: Secondary | ICD-10-CM

## 2024-01-24 ENCOUNTER — Encounter

## 2024-01-24 DIAGNOSIS — G4733 Obstructive sleep apnea (adult) (pediatric): Secondary | ICD-10-CM

## 2024-02-07 DIAGNOSIS — G4733 Obstructive sleep apnea (adult) (pediatric): Secondary | ICD-10-CM | POA: Diagnosis not present

## 2024-02-18 ENCOUNTER — Encounter: Payer: Self-pay | Admitting: Sleep Medicine

## 2024-02-18 ENCOUNTER — Encounter: Payer: Self-pay | Admitting: Internal Medicine

## 2024-02-18 ENCOUNTER — Ambulatory Visit: Admitting: Sleep Medicine

## 2024-02-18 ENCOUNTER — Ambulatory Visit (INDEPENDENT_AMBULATORY_CARE_PROVIDER_SITE_OTHER): Payer: BC Managed Care – PPO | Admitting: Internal Medicine

## 2024-02-18 VITALS — BP 106/70 | HR 60 | Temp 97.3°F | Ht 62.0 in | Wt 164.2 lb

## 2024-02-18 VITALS — BP 108/60 | HR 78 | Resp 20 | Ht 62.0 in | Wt 163.8 lb

## 2024-02-18 DIAGNOSIS — G4733 Obstructive sleep apnea (adult) (pediatric): Secondary | ICD-10-CM

## 2024-02-18 DIAGNOSIS — E0789 Other specified disorders of thyroid: Secondary | ICD-10-CM

## 2024-02-18 DIAGNOSIS — F411 Generalized anxiety disorder: Secondary | ICD-10-CM

## 2024-02-18 DIAGNOSIS — E221 Hyperprolactinemia: Secondary | ICD-10-CM

## 2024-02-18 NOTE — Patient Instructions (Signed)

## 2024-02-18 NOTE — Patient Instructions (Signed)
 HOLD Biotin 3 days before your labs work

## 2024-02-18 NOTE — Progress Notes (Unsigned)
 Name: Mckenzie Brown  MRN/ DOB: [id number], 2001-05-28    Age/ Sex: 23 y.o., female     PCP: Arlyss Queen   Reason for Endocrinology Evaluation: Hyperprolactinemia     Initial Endocrinology Clinic Visit: 09/11/2019    PATIENT IDENTIFIER: [name] is a 23 y.o., female with a past medical history of hyperprolactinemia . She has followed with  Endocrinology clinic since 09/11/2019 for consultative assistance with management of her hyperprolactinemia.   HISTORICAL SUMMARY: The patient was first diagnosed with hyperprolactinemia in 2018, with a max level of 51.2 NG/mL in January 2018.  MRI negative for pituitary pathology 2019 She was on OCPs from 2013-2018 She is G1P0  Mirena caused headaches Bromocriptine caused acne Cabergoline was stopped 05/2022   Patient has been noted with normal TSH and elevated free T4 and free T3 08/2021.  MRI did not reveal any pituitary adenoma 12/2022   Saliva cortisol x 2 normal 11/2023  Paternal history of thyroid disease  SUBJECTIVE:    Today (02/18/2024):  Mckenzie Brown is here for hyperprolactinemia and elevated FT4 and T3.  Pt with Migraine headaches that are stable No vision changes  LMP last week -regular Anxiety improved with Buspar Denies palpitations  Has 2-3 BM's a day, loose stools in general   Takes Biotin   HISTORY:  Past Medical History:  Past Medical History:  Diagnosis Date   Anxiety disorder of adolescence 04/12/2016   Major depressive disorder    Migraines    Suicide attempt by drug ingestion [facility])    Past Surgical History:  Past Surgical History:  Procedure Laterality Date   DILATION AND EVACUATION N/A 09/03/2015   Procedure: DILATATION AND EVACUATION;  Surgeon: Vena Austria, MD;  Location: [facility] ORS;  Service: Gynecology;  Laterality: N/A;   dnc     TONSILLECTOMY N/A 04/25/2017   Procedure: TONSILLECTOMY;  Surgeon: Bud Face, MD;  Location: [facility];  Service: ENT;   Laterality: N/A;   TONSILLECTOMY     WISDOM TOOTH EXTRACTION     Social History:  reports that she has never smoked. She has been exposed to tobacco smoke. She has never used smokeless tobacco. She reports that she does not drink alcohol and does not use drugs. Family History:  Family History  Problem Relation Age of Onset   Migraines Mother    Hypertension Mother    Anxiety disorder Mother    Depression Mother    Parkinsonism Maternal Grandmother    Migraines Maternal Grandmother    Dementia Maternal Grandmother    Diabetes Maternal Grandmother    Hypertension Maternal Grandmother    Seizures Maternal Grandfather    Parkinsonism Maternal Grandfather    Dementia Maternal Grandfather    Diabetes Maternal Grandfather    Parkinson's disease Maternal Grandfather    Other Neg Hx        pituitary disorder     HOME MEDICATIONS: Allergies as of 02/18/2024   No Known Allergies      Medication List        Accurate as of February 18, 2024  9:44 AM. If you have any questions, ask your nurse or doctor.          buPROPion 300 MG 24 hr tablet Commonly known as: WELLBUTRIN XL Take 300 mg by mouth daily.   busPIRone 10 MG tablet Commonly known as: BUSPAR Take 10 mg by mouth 2 (two) times daily.   HAIR SKIN & NAILS GUMMIES PO Take by mouth.  OBJECTIVE:   PHYSICAL EXAM: VS: BP 108/60 (BP Location: Right Arm, Patient Position: Sitting, Cuff Size: Normal)   Pulse 78   Resp 20   Ht 5\' 2"  (1.575 m)   Wt 163 lb 12.8 oz (74.3 kg)   SpO2 99%   BMI 29.96 kg/m    Filed Weights   02/18/24 0943  Weight: 163 lb 12.8 oz (74.3 kg)     Exam: General: Pt appears well and is in NAD  Neck: General: Supple without adenopathy. Thyroid: Thyroid size normal.  No goiter or nodules appreciated.   Lungs: Clear with good BS bilat   Heart: RRR   Abdomen:  soft, nontender  Extremities: No pretibial edema.   Skin:  No rash noted.  Neuro: MS is good with appropriate affect,  pt is alert and Ox3      DATA REVIEWED:  Latest Reference Range & Units 11/16/22 08:21  Prolactin ng/mL 24.6  TSH 0.35 - 5.50 uIU/mL 1.69  T4,Free(Direct) 0.60 - 1.60 ng/dL 1.61 (H)  (H): Data is abnormally high     Latest Reference Range & Units 07/14/22 09:01  Sodium 135 - 145 mEq/L 138  Potassium 3.5 - 5.1 mEq/L 4.6  Chloride 96 - 112 mEq/L 105  CO2 19 - 32 mEq/L 28  Glucose 70 - 99 mg/dL 91  BUN 6 - 23 mg/dL 20  Creatinine 0.96 - 0.45 mg/dL 4.09  Calcium 8.4 - 81.1 mg/dL 9.3  GFR >91.47 mL/min 112.91    Latest Reference Range & Units 07/14/22 09:01  Prolactin ng/mL 25.9  Glucose 70 - 99 mg/dL 91  TSH 8.29 - 5.62 uIU/mL 1.64  T4,Free(Direct) 0.60 - 1.60 ng/dL 1.30 (H)      Brain MRI 12/21/2022   Similar to the prior brain MRI of 09/07/2018, there are a few small foci of T2 FLAIR hyperintense signal abnormality scattered within the bilateral cerebral white matter (the largest within the left parietal lobe subcortical white matter measuring 2-3 mm).   There is no acute infarct.   No evidence of an intracranial mass.   No chronic intracranial blood products.   No extra-axial fluid collection.   No midline shift.   No pathologic intracranial enhancement identified.   Vascular: Maintained flow voids within the proximal large arterial vessels.   Skull and upper cervical spine: No focal suspicious marrow lesion.   Sinuses/Orbits: No mass or acute finding within the imaged orbits. 2 cm mucous retention cyst within the right maxillary sinus.   IMPRESSION: 1. No evidence of a pituitary adenoma. 2. Similar to the prior brain MRI of 09/08/2018, there are a few small foci of T2 FLAIR hyperintense signal abnormality scattered within the bilateral cerebral white matter (measuring up to 2-3 mm). These signal changes are nonspecific and differential considerations include sequelae of chronic migraine headaches, sequelae of a prior infectious/inflammatory process,  sequelae of a demyelinating process or age advanced chronic small vessel ischemic disease, among others. 3. Otherwise unremarkable MRI appearance of the brain. 4. 2 cm mucous retention cyst within the right maxillary si  Thyroid ultrasound 09/14/2021 Estimated total number of nodules >/= 1 cm: 0   Number of spongiform nodules >/=  2 cm not described below (TR1): 0   Number of mixed cystic and solid nodules >/= 1.5 cm not described below (TR2): 0   _________________________________________________________   7 mm cystic nodule in the mid right thyroid lobe does not meet criteria for FNA or imaging surveillance.   IMPRESSION: 1. Moderate diffuse heterogeneity of  the thyroid parenchyma. 2. Solitary subcentimeter cystic right thyroid nodule does not meet criteria for imaging surveillance or FNA.    ASSESSMENT / PLAN / RECOMMENDATIONS:   Hyperprolactinemia:   - No clinical evidence of hyperprolactinemia with lack of galactorrhea and regulation of menstrual cycle  - She has been off cabergoline since ~06/2022 - MRI negative for pituitary adenoma 2024 -Will repeat prolactin as well as saliva cortisol testing for Cushing disease    2. Thyromegaly :  - Reviewed thyroid ultrasound , right sub-centimeter nodule noted - No indications for serial monitoring  - No local neck symptoms  - Pt has been noted with elevated FT4 but normal TSH.   3. Elevated FT4   - Most likely this is due to resistance to thyroid hormone, another differential diagnosis is  TSH producing adenoma .But her MRI in 2024 did NOT reveal any adenoma -She has been noted with anxiety but no other clinical evidence of hyperthyroidism - Will check Alpha subunit    F/U in 6 months   Patient will contact office to schedule a lab appointment  Signed electronically by: Natale Bail, MD  [facility] Endocrinology  [facility] [address] [place] [place], [place] [address] Phone:  [phone] FAX: [phone]      CC: Lavaughn [place] [address] [place] [address] Phone: [phone]  Fax: [phone]   Return to Endocrinology clinic as below: Future Appointments  Date Time Provider Department Center  02/18/2024 11:15 AM Woodson He, MD [id number] None  06/06/2024  9:10 AM Merriam Abbey, DO [facility] None

## 2024-02-18 NOTE — Progress Notes (Signed)
 Name:Mckenzie Brown MRN: [id number] DOB: 2001-01-02   CHIEF COMPLAINT:  HST F/U   HISTORY OF PRESENT ILLNESS:  Mckenzie Brown is a 23 y.o. w/ a h/o anxiety who presents to follow up on HST results. Patient recently underwent HST which revealed mild OSA (AHI 10, O2 nadir 86%).     EPWORTH SLEEP SCORE    01/09/2024    3:00 PM  Results of the Epworth flowsheet  Sitting and reading 3  Watching TV 2  Sitting, inactive in a public place (e.g. 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  has a past surgical history that includes Dilation and evacuation (N/A, 09/03/2015); Tonsillectomy (N/A, 04/25/2017); Tonsillectomy; dnc; and Wisdom tooth extraction. Prior to Admission medications   Medication Sig Start Date End Date Taking? Authorizing Provider  Biotin w/ Vitamins C & E (HAIR SKIN & NAILS GUMMIES PO) Take by mouth.   Yes [provider]  buPROPion (WELLBUTRIN XL) 300 MG 24 hr tablet Take 300 mg by mouth daily. 10/15/23  Yes [provider]  busPIRone (BUSPAR) 10 MG tablet Take 10 mg by mouth 2 (two) times daily. 10/15/23  Yes [provider]   No Known Allergies  FAMILY HISTORY:  family history includes Anxiety disorder in her mother; Dementia in her maternal grandfather and maternal grandmother; Depression in her mother; Diabetes in her maternal grandfather and maternal grandmother; Hypertension in her maternal grandmother and mother; Migraines in her maternal grandmother and mother; Parkinson's disease in her  maternal grandfather; Parkinsonism in her maternal grandfather and maternal grandmother; Seizures in her maternal grandfather. SOCIAL HISTORY:  reports that she has never smoked. She has been exposed to tobacco smoke. She has never used smokeless tobacco. She reports that she does not drink alcohol and does not use drugs.   Review of Systems:  Gen:  Denies  fever, sweats, chills weight loss  HEENT: Denies blurred vision, double vision, ear pain, eye pain, hearing loss, nose bleeds, sore throat Cardiac:  No dizziness, chest pain or heaviness, chest tightness,edema, No JVD Resp:   No cough, -sputum production, -shortness of breath,-wheezing, -hemoptysis,  Gi: Denies swallowing difficulty, stomach pain, nausea or vomiting, diarrhea, constipation, bowel incontinence Gu:  Denies bladder incontinence, burning urine Ext:   Denies Joint pain, stiffness or swelling Skin: Denies  skin rash, easy bruising or bleeding or hives Endoc:  Denies polyuria, polydipsia , polyphagia or weight change Psych:   Denies depression, insomnia or hallucinations  Other:  All other systems negative  VITAL SIGNS: BP 106/70 (BP Location: Right Arm, Cuff Size: Normal)   Pulse 60   Temp (!) 97.3 F (36.3 C)   Ht 5\' 2"  (1.575 m)   Wt 164 lb 3.2 oz (74.5 kg)   SpO2 100%   BMI 30.03 kg/m     Physical Examination:   General Appearance: No distress  EYES PERRLA, EOM intact.   NECK Supple, No JVD Pulmonary: normal breath sounds, No wheezing.  CardiovascularNormal S1,S2.  No m/r/g.   Abdomen: Benign, Soft, non-tender. Skin:   warm, no rashes, no ecchymosis  Extremities: normal, no cyanosis, clubbing. Neuro:without focal findings,  speech normal  PSYCHIATRIC: Mood, affect within normal limits.   ASSESSMENT AND PLAN  OSA Reviewed HST results with patient. Starting on APAP therapy set to 4-16 cm H2O. Discussed the consequences of untreated sleep apnea. Advised not to drive drowsy for safety of patient and  others. Will follow up in 3 months to review CPAP efficacy and compliance data.    Anxiety Stable, on current management. Following with PCP.    Patient  satisfied with Plan of action and management. All questions answered  I spent a total of 32 minutes reviewing chart data, face-to-face evaluation with the patient, counseling and coordination of care as detailed above.    Jevaughn Degollado, M.D.  Sleep Medicine [facility]

## 2024-03-12 NOTE — Progress Notes (Signed)
 GYNECOLOGY: ANNUAL EXAM   Subjective:    PCP: Aloha Arnold, PA-C Priya L Kennington is a 23 y.o. female G1P0010 who presents for annual wellness visit.   Concerns:  1) Wants to get pregnant, PMH of hyperprolactinemia that was contributing to irregular periods. Is now improved under the care of endocrinology and pt also has lost 30lbs. Desiring pregnancy sometime next year.   Well Woman Visit:  GYN HISTORY:  Patient's last menstrual period was 02/10/2024.     Menstrual History: OB History     Gravida  1   Para  0   Term  0   Preterm  0   AB  1   Living         SAB  1   IAB  0   Ectopic  0   Multiple      Live Births              Menarche age: 22 Patient's last menstrual period was 02/10/2024. Period Cycle (Days): 60 Period Duration (Days): 4-5 Period Pattern: (!) Irregular Menstrual Flow: Light Dysmenorrhea: (!) Mild Dysmenorrhea Symptoms: Cramping   Intermenstrual bleeding, spotting, or discharge? no Urinary incontinence? no  Sexually active: yes Number of sexual partners: 1 Gender of sexual Partners: male Social History   Substance and Sexual Activity  Sexual Activity Yes   Contraceptive methods: no method Dyspareunia? no STI history: no STI/HIV testing or immunizations needed? No.   Health Maintenance: -Last pap: was normal 12/27/20 NILM --> Any abnormals: no -Last mammogram:  --> Any abnormals?  -Last colon cancer screen: n/a / Type: n/a -Last DEXA scan: n/a [facility] of Breast / Colon / Cervical cancer: no -Vaccines:  Immunization History  Administered Date(s) Administered   Influenza-Unspecified 09/07/2023   Tdap 08/15/2017   Last Tdap: utd / Flu: utd / COVID: utd / Gardasil: unsure / Shingles (50+): n/a / PCV20: n/a -Hep C screen: pcp -Last lipid / glucose screening: pcp  > Exercise: , moderately active > Dietary Supplements: Folate: No;  Calcium: No}; Vitamin D: No > Body mass index is 29.81 kg/m.  > Recent dental visit  Yes.   > Seat Belt Use: Yes.   > Texting and driving? Yes.   > Guns in the house Yes.   > Recreational or other drug use: denied.   Social History   Tobacco Use   Smoking status: Never    Passive exposure: Yes   Smokeless tobacco: Never  Substance Use Topics   Alcohol use: No   Occupation: Sales executive     Lives with: husband    PHQ-2 Score: In last two weeks, how often have you felt: Little interest or pleasure in doing things: Not at all (0) Feeling down, depressed or hopeless: Not at all (0) Score: 0  GAD-2 Over the last 2 weeks, how often have you been bothered by the following problems? Feeling nervous, anxious or on edge: Not at all (0) Not being able to stop or control worrying: Not at all (0)} Score: 0 _________________________________________________________  Current Outpatient Medications  Medication Sig Dispense Refill   buPROPion (WELLBUTRIN XL) 300 MG 24 hr tablet Take 300 mg by mouth daily.     busPIRone (BUSPAR) 10 MG tablet Take 10 mg by mouth 2 (two) times daily.     Biotin w/ Vitamins C & E (HAIR SKIN & NAILS GUMMIES PO) Take by mouth. (Patient not taking: Reported on 03/14/2024)     No current facility-administered medications for this  visit.   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   Major depressive disorder    Migraines    Suicide attempt by drug ingestion [facility])    Past Surgical History:  Procedure Laterality Date   DILATION AND EVACUATION N/A 09/03/2015   Procedure: DILATATION AND EVACUATION;  Surgeon: Darl Edu, MD;  Location: [facility];  Service: Gynecology;  Laterality: N/A;   dnc     TONSILLECTOMY N/A 04/25/2017   Procedure: TONSILLECTOMY;  Surgeon: Rogers Clayman, MD;  Location: [facility];  Service: ENT;  Laterality: N/A;   TONSILLECTOMY     WISDOM TOOTH EXTRACTION      Review Of Systems  Constitutional: Denied constitutional symptoms, night sweats,  recent illness, fatigue, fever, insomnia and weight loss.  Eyes: Denied eye symptoms, eye pain, photophobia, vision change and visual disturbance.  Ears/Nose/Throat/Neck: Denied ear, nose, throat or neck symptoms, hearing loss, nasal discharge, sinus congestion and sore throat.  Cardiovascular: Denied cardiovascular symptoms, arrhythmia, chest pain/pressure, edema, exercise intolerance, orthopnea and palpitations.  Respiratory: Denied pulmonary symptoms, asthma, pleuritic pain, productive sputum, cough, dyspnea and wheezing.  Gastrointestinal: Denied, gastro-esophageal reflux, melena, nausea and vomiting.  Genitourinary: Denied genitourinary symptoms including symptomatic vaginal discharge, pelvic relaxation issues, and urinary complaints.  Musculoskeletal: Denied musculoskeletal symptoms, stiffness, swelling, muscle weakness and myalgia.  Dermatologic: Denied dermatology symptoms, rash and scar.  Neurologic: Denied neurology symptoms, dizziness, headache, neck pain and syncope.  Psychiatric: Denied psychiatric symptoms, anxiety and depression.  Endocrine: Denied endocrine symptoms including hot flashes and night sweats.      Objective:    BP 117/76   Pulse 69   Ht 5\' 2"  (1.575 m)   Wt 163 lb (73.9 kg)   LMP 02/10/2024   BMI 29.81 kg/m   Constitutional: Well-developed, well-nourished female in no acute distress Neurological: Alert and oriented to person, place, and time Psychiatric: Mood and affect appropriate Skin: No rashes or lesions Neck: Supple without masses. Trachea is midline.Thyroid  is normal size without masses Lymphatics: No cervical, axillary, supraclavicular, or inguinal adenopathy noted Respiratory: Clear to auscultation bilaterally. Good air movement with normal work of breathing. Cardiovascular: Regular rate and rhythm. Extremities grossly normal, nontender with no edema; pulses regular Gastrointestinal: Soft, nontender, nondistended. No masses or hernias appreciated.  No hepatosplenomegaly. No fluid wave. No rebound or guarding. Breast Exam: normal appearance, no masses or tenderness, Inspection negative, No nipple retraction or dimpling, No nipple discharge or bleeding, No axillary or supraclavicular adenopathy, Normal to palpation without dominant masses Genitourinary:         External Genitalia: Normal female genitalia    Vagina: Normal mucosa, no lesions.    Cervix: No lesions, normal size and consistency; no cervical motion tenderness; non-friable; Pap obtained.    Uterus: Normal size and contour; smooth, mobile, NT. Adnexae: Non-palpable and non-tender Perineum/Anus: No lesions Rectal: deferred    Assessment/Plan:    Chaunta L Deramo is a 23 y.o. female G51P0010 with normal well-woman gynecologic exam.  -Screenings:  Pap: done w/rflx today Labs: will defer hormonal testing to pre-conception visit GAD/PHQ-2 = 0 Contraception: none -Vaccines: Gardasil counseling done today, will consider -Healthy lifestyle modifications discussed: multivitamin, diet, exercise, sunscreen, tobacco and alcohol use. Emphasized importance of regular physical activity.  -Folate recommendation reviewed.  -All questions answered to patient's satisfaction.   Return in about 1 year (around 03/14/2025) for annual; pre-conception visit w/husband when able.    Sofia Dunn, DO [facility]

## 2024-03-13 NOTE — Patient Instructions (Signed)

## 2024-03-14 ENCOUNTER — Encounter: Payer: Self-pay | Admitting: Obstetrics

## 2024-03-14 ENCOUNTER — Ambulatory Visit (INDEPENDENT_AMBULATORY_CARE_PROVIDER_SITE_OTHER): Admitting: Obstetrics

## 2024-03-14 ENCOUNTER — Other Ambulatory Visit (HOSPITAL_COMMUNITY)
Admission: RE | Admit: 2024-03-14 | Discharge: 2024-03-14 | Disposition: A | Discharge status: Home / Self Care | Source: Ambulatory Visit | Attending: Obstetrics | Admitting: Obstetrics

## 2024-03-14 VITALS — BP 117/76 | HR 69 | Ht 62.0 in | Wt 163.0 lb

## 2024-03-14 DIAGNOSIS — Z124 Encounter for screening for malignant neoplasm of cervix: Secondary | ICD-10-CM | POA: Diagnosis not present

## 2024-03-14 DIAGNOSIS — Z7185 Encounter for immunization safety counseling: Secondary | ICD-10-CM

## 2024-03-14 DIAGNOSIS — Z01419 Encounter for gynecological examination (general) (routine) without abnormal findings: Secondary | ICD-10-CM

## 2024-03-14 DIAGNOSIS — Z113 Encounter for screening for infections with a predominantly sexual mode of transmission: Secondary | ICD-10-CM

## 2024-03-17 DIAGNOSIS — D352 Benign neoplasm of pituitary gland: Secondary | ICD-10-CM | POA: Diagnosis not present

## 2024-03-17 DIAGNOSIS — E0789 Other specified disorders of thyroid: Secondary | ICD-10-CM | POA: Diagnosis not present

## 2024-03-19 LAB — CYTOLOGY - PAP
Chlamydia: NEGATIVE
Comment: NEGATIVE
Comment: NORMAL
Diagnosis: NEGATIVE
Neisseria Gonorrhea: NEGATIVE

## 2024-03-20 ENCOUNTER — Ambulatory Visit: Payer: Self-pay | Admitting: Obstetrics

## 2024-03-23 LAB — TSH: TSH: 2.37 u[IU]/mL (ref 0.450–4.500)

## 2024-03-23 LAB — T4, FREE: Free T4: 2.18 ng/dL — ABNORMAL HIGH (ref 0.82–1.77)

## 2024-03-23 LAB — THYROID STIMULATING IMMUNOGLOBULIN: Thyroid Stim Immunoglobulin: <0.1 IU/L (ref 0.00–0.55)

## 2024-03-23 LAB — T3, FREE: T3, Free: 4.7 pg/mL — ABNORMAL HIGH (ref 2.0–4.4)

## 2024-03-23 LAB — THYROTROPIN RECEPTOR AUTOABS: Thyrotropin Receptor Ab: <1.1 IU/L (ref 0.00–1.75)

## 2024-03-23 LAB — ALPHA SUBUNIT (FREE): Alpha Subunit (Free): <0.15 ng/mL

## 2024-03-25 ENCOUNTER — Ambulatory Visit: Payer: Self-pay | Admitting: Internal Medicine

## 2024-03-27 LAB — PROLACTIN

## 2024-04-14 DIAGNOSIS — E0789 Other specified disorders of thyroid: Secondary | ICD-10-CM | POA: Diagnosis not present

## 2024-04-14 DIAGNOSIS — F418 Other specified anxiety disorders: Secondary | ICD-10-CM | POA: Diagnosis not present

## 2024-04-14 DIAGNOSIS — G4733 Obstructive sleep apnea (adult) (pediatric): Secondary | ICD-10-CM | POA: Diagnosis not present

## 2024-04-14 DIAGNOSIS — R635 Abnormal weight gain: Secondary | ICD-10-CM | POA: Diagnosis not present

## 2024-05-23 ENCOUNTER — Ambulatory Visit: Admitting: Sleep Medicine

## 2024-06-06 ENCOUNTER — Ambulatory Visit: Payer: BC Managed Care – PPO | Admitting: Neurology

## 2024-06-09 DIAGNOSIS — G4733 Obstructive sleep apnea (adult) (pediatric): Secondary | ICD-10-CM | POA: Diagnosis not present

## 2024-06-09 DIAGNOSIS — L709 Acne, unspecified: Secondary | ICD-10-CM | POA: Diagnosis not present

## 2024-06-09 DIAGNOSIS — E0789 Other specified disorders of thyroid: Secondary | ICD-10-CM | POA: Diagnosis not present

## 2024-06-09 DIAGNOSIS — F418 Other specified anxiety disorders: Secondary | ICD-10-CM | POA: Diagnosis not present

## 2024-07-03 ENCOUNTER — Encounter: Payer: Self-pay | Admitting: Obstetrics

## 2024-07-08 ENCOUNTER — Telehealth: Payer: Self-pay

## 2024-07-08 NOTE — Telephone Encounter (Signed)
 returing call that was left on triage vocimeail pt did not answer.

## 2024-09-08 DIAGNOSIS — L600 Ingrowing nail: Secondary | ICD-10-CM | POA: Diagnosis not present

## 2024-11-24 ENCOUNTER — Ambulatory Visit: Admitting: Sleep Medicine

## 2024-11-24 ENCOUNTER — Encounter: Payer: Self-pay | Admitting: Sleep Medicine

## 2024-11-24 VITALS — BP 90/60 | HR 67 | Temp 98.0°F | Ht 62.0 in | Wt 166.8 lb

## 2024-11-24 DIAGNOSIS — E669 Obesity, unspecified: Secondary | ICD-10-CM

## 2024-11-24 DIAGNOSIS — F419 Anxiety disorder, unspecified: Secondary | ICD-10-CM

## 2024-11-24 DIAGNOSIS — Z6830 Body mass index (BMI) 30.0-30.9, adult: Secondary | ICD-10-CM

## 2024-11-24 DIAGNOSIS — G4733 Obstructive sleep apnea (adult) (pediatric): Secondary | ICD-10-CM | POA: Diagnosis not present

## 2024-11-24 DIAGNOSIS — F411 Generalized anxiety disorder: Secondary | ICD-10-CM

## 2024-11-24 DIAGNOSIS — E66811 Obesity, class 1: Secondary | ICD-10-CM

## 2024-11-24 NOTE — Patient Instructions (Signed)

## 2024-11-24 NOTE — Progress Notes (Signed)
 "      Name:Mckenzie Brown MRN: [id number] DOB: [birth year]   CHIEF COMPLAINT:  HST F/U   HISTORY OF PRESENT ILLNESS:  Mckenzie Brown is a 24 y.o. w/ a h/o anxiety and obesity who presents to follow up on HST results. Patient recently underwent HST which revealed mild OSA (AHI 10, O2 nadir 86%).     EPWORTH SLEEP SCORE    01/09/2024    3:00 PM  Results of the Epworth flowsheet  Sitting and reading 3  Watching TV 2  Sitting, inactive in a public place (e.g. 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  has a past surgical history that includes Dilation and evacuation (N/A, 09/03/2015); Tonsillectomy (N/A, 04/25/2017); Tonsillectomy; dnc; and Wisdom tooth extraction. Prior to Admission medications   Medication Sig Start Date End Date Taking? Authorizing Provider  Biotin w/ Vitamins C & E (HAIR SKIN & NAILS GUMMIES PO) Take by mouth.   Yes [provider]  buPROPion (WELLBUTRIN XL) 300 MG 24 hr tablet Take 300 mg by mouth daily. 10/15/23  Yes [provider]  busPIRone (BUSPAR) 10 MG tablet Take 10 mg by mouth 2 (two) times daily. 10/15/23  Yes [provider]   No Known Allergies  FAMILY HISTORY:  family history includes Anxiety disorder in her mother; Dementia in her maternal grandfather and maternal grandmother; Depression in her mother; Diabetes in her maternal grandfather and maternal grandmother; Hypertension in her maternal grandmother and mother; Migraines in her maternal grandmother and mother; Parkinson's disease  in her maternal grandfather; Parkinsonism in her maternal grandfather and maternal grandmother; Seizures in her maternal grandfather. SOCIAL HISTORY:  reports that she has never smoked. She has been exposed to tobacco smoke. She has never used smokeless tobacco. She reports that she does not drink alcohol and does not use drugs.   Review of Systems:  Gen:  Denies  fever, sweats, chills weight loss  HEENT: Denies blurred vision, double vision, ear pain, eye pain, hearing loss, nose bleeds, sore throat Cardiac:  No dizziness, chest pain or heaviness, chest tightness,edema, No JVD Resp:   No cough, -sputum production, -shortness of breath,-wheezing, -hemoptysis,  Gi: Denies swallowing difficulty, stomach pain, nausea or vomiting, diarrhea, constipation, bowel incontinence Gu:  Denies bladder incontinence, burning urine Ext:   Denies Joint pain, stiffness or swelling Skin: Denies  skin rash, easy bruising or bleeding or hives Endoc:  Denies polyuria, polydipsia , polyphagia or weight change Psych:   Denies depression, insomnia or hallucinations  Other:  All other systems negative  VITAL SIGNS: BP 90/60   Pulse 67   Temp 98 F (36.7 C)   Ht 5' 2 (1.575 m)   Wt 166 lb 12.8 oz (75.7 kg)   LMP 10/29/2024 (Exact Date)   SpO2 99%   BMI 30.51 kg/m     Physical Examination:   General Appearance: No distress  EYES PERRLA, EOM intact.   NECK Supple, No JVD Pulmonary: normal breath sounds, No wheezing.  CardiovascularNormal S1,S2.  No m/r/g.   Abdomen: Benign, Soft, non-tender. Skin:   warm, no rashes, no ecchymosis  Extremities: normal, no cyanosis, clubbing. Neuro:without focal findings,  speech normal  PSYCHIATRIC: Mood, affect within normal limits.   ASSESSMENT AND PLAN  OSA Reviewed HST results with patient. Starting on APAP therapy set to 4-16 cm H2O. Discussed the consequences of untreated sleep apnea. Advised not to drive drowsy for safety of patient and others. Will follow  up in 3 months to review CPAP efficacy and compliance data.    Anxiety Stable, on current management. Following with PCP.   Obesity Counseled patient on diet and lifestyle modification.    Patient  satisfied with Plan of action and management. All questions answered  I spent a total of 22 minutes reviewing chart data, face-to-face evaluation with the patient, counseling and coordination of care as detailed above.    Josaiah Muhammed, M.D.  Sleep Medicine [facility]        "

## 2024-11-27 NOTE — Addendum Note (Signed)
 Addended by: Kindred Heying on: 11/27/2024 12:59 PM   Modules accepted: Level of Service

## 2025-02-13 ENCOUNTER — Ambulatory Visit: Admitting: Internal Medicine

## 2025-02-16 ENCOUNTER — Ambulatory Visit: Admitting: Sleep Medicine
